# Patient Record
Sex: Male | Born: 1970 | Race: Black or African American | Hispanic: No | Marital: Single | Smoking: Current every day smoker
Health system: Southern US, Community
[De-identification: ages and names within clinical notes are randomized; demographics above are authoritative.]

## PROBLEM LIST (undated history)

## (undated) DIAGNOSIS — Z87442 Personal history of urinary calculi: Secondary | ICD-10-CM

## (undated) DIAGNOSIS — I10 Essential (primary) hypertension: Secondary | ICD-10-CM

## (undated) HISTORY — PX: BACK SURGERY: SHX140

---

## 2001-09-13 ENCOUNTER — Encounter: Payer: Self-pay | Admitting: Emergency Medicine

## 2001-09-13 ENCOUNTER — Emergency Department (HOSPITAL_COMMUNITY): Admission: EM | Admit: 2001-09-13 | Discharge: 2001-09-13 | Payer: Self-pay | Admitting: *Deleted

## 2003-10-16 ENCOUNTER — Emergency Department (HOSPITAL_COMMUNITY): Admission: EM | Admit: 2003-10-16 | Discharge: 2003-10-16 | Payer: Self-pay | Admitting: Emergency Medicine

## 2005-01-09 ENCOUNTER — Emergency Department (HOSPITAL_COMMUNITY): Admission: EM | Admit: 2005-01-09 | Discharge: 2005-01-09 | Payer: Self-pay | Admitting: Emergency Medicine

## 2005-12-01 ENCOUNTER — Emergency Department (HOSPITAL_COMMUNITY): Admission: EM | Admit: 2005-12-01 | Discharge: 2005-12-01 | Payer: Self-pay | Admitting: Emergency Medicine

## 2007-10-07 ENCOUNTER — Emergency Department (HOSPITAL_COMMUNITY): Admission: EM | Admit: 2007-10-07 | Discharge: 2007-10-07 | Payer: Self-pay | Admitting: Emergency Medicine

## 2009-03-16 ENCOUNTER — Emergency Department (HOSPITAL_COMMUNITY): Admission: EM | Admit: 2009-03-16 | Discharge: 2009-03-16 | Payer: Self-pay | Admitting: *Deleted

## 2010-12-01 ENCOUNTER — Encounter
Admission: RE | Admit: 2010-12-01 | Discharge: 2010-12-01 | Payer: Self-pay | Source: Home / Self Care | Admitting: Family Medicine

## 2011-03-27 ENCOUNTER — Emergency Department (HOSPITAL_BASED_OUTPATIENT_CLINIC_OR_DEPARTMENT_OTHER)
Admission: EM | Admit: 2011-03-27 | Discharge: 2011-03-27 | Disposition: A | Discharge status: Home / Self Care | Payer: BC Managed Care – PPO | Attending: Emergency Medicine | Admitting: Emergency Medicine

## 2011-03-27 DIAGNOSIS — M545 Low back pain, unspecified: Secondary | ICD-10-CM | POA: Insufficient documentation

## 2011-03-27 DIAGNOSIS — G8929 Other chronic pain: Secondary | ICD-10-CM | POA: Insufficient documentation

## 2011-04-06 LAB — COMPREHENSIVE METABOLIC PANEL
Albumin: 4.4 g/dL (ref 3.5–5.2)
BUN: 17 mg/dL (ref 6–23)
Chloride: 99 mEq/L (ref 96–112)
Creatinine, Ser: 1.28 mg/dL (ref 0.4–1.5)
Total Bilirubin: 1.9 mg/dL — ABNORMAL HIGH (ref 0.3–1.2)

## 2011-04-06 LAB — DIFFERENTIAL
Basophils Absolute: 0 10*3/uL (ref 0.0–0.1)
Lymphocytes Relative: 32 % (ref 12–46)
Monocytes Absolute: 0.5 10*3/uL (ref 0.1–1.0)
Neutro Abs: 4.8 10*3/uL (ref 1.7–7.7)
Neutrophils Relative %: 60 % (ref 43–77)

## 2011-04-06 LAB — CBC
HCT: 51.2 % (ref 39.0–52.0)
MCV: 89.7 fL (ref 78.0–100.0)
Platelets: 310 10*3/uL (ref 150–400)
RDW: 13 % (ref 11.5–15.5)
WBC: 7.9 10*3/uL (ref 4.0–10.5)

## 2011-04-06 LAB — URINALYSIS, ROUTINE W REFLEX MICROSCOPIC
Nitrite: NEGATIVE
Specific Gravity, Urine: 1.04 — ABNORMAL HIGH (ref 1.005–1.030)
Urobilinogen, UA: 1 mg/dL (ref 0.0–1.0)

## 2011-04-06 LAB — URINE MICROSCOPIC-ADD ON

## 2011-04-06 LAB — POTASSIUM: Potassium: 3.6 mEq/L (ref 3.5–5.1)

## 2011-04-06 LAB — LIPASE, BLOOD: Lipase: 30 U/L (ref 11–59)

## 2011-08-25 ENCOUNTER — Emergency Department (HOSPITAL_BASED_OUTPATIENT_CLINIC_OR_DEPARTMENT_OTHER)
Admission: EM | Admit: 2011-08-25 | Discharge: 2011-08-25 | Disposition: A | Discharge status: Other Acute Inpatient Hospital | Payer: Managed Care, Other (non HMO) | Source: Home / Self Care | Attending: Emergency Medicine | Admitting: Emergency Medicine

## 2011-08-25 ENCOUNTER — Emergency Department (HOSPITAL_COMMUNITY)
Admission: EM | Admit: 2011-08-25 | Discharge: 2011-08-25 | Disposition: A | Discharge status: Home / Self Care | Payer: Managed Care, Other (non HMO) | Attending: Emergency Medicine | Admitting: Emergency Medicine

## 2011-08-25 ENCOUNTER — Encounter: Payer: Self-pay | Admitting: *Deleted

## 2011-08-25 ENCOUNTER — Emergency Department (INDEPENDENT_AMBULATORY_CARE_PROVIDER_SITE_OTHER): Payer: Managed Care, Other (non HMO)

## 2011-08-25 DIAGNOSIS — R112 Nausea with vomiting, unspecified: Secondary | ICD-10-CM | POA: Insufficient documentation

## 2011-08-25 DIAGNOSIS — K4090 Unilateral inguinal hernia, without obstruction or gangrene, not specified as recurrent: Secondary | ICD-10-CM

## 2011-08-25 DIAGNOSIS — R197 Diarrhea, unspecified: Secondary | ICD-10-CM | POA: Insufficient documentation

## 2011-08-25 DIAGNOSIS — R1032 Left lower quadrant pain: Secondary | ICD-10-CM | POA: Insufficient documentation

## 2011-08-25 DIAGNOSIS — I10 Essential (primary) hypertension: Secondary | ICD-10-CM | POA: Insufficient documentation

## 2011-08-25 DIAGNOSIS — R109 Unspecified abdominal pain: Secondary | ICD-10-CM

## 2011-08-25 DIAGNOSIS — K469 Unspecified abdominal hernia without obstruction or gangrene: Secondary | ICD-10-CM | POA: Insufficient documentation

## 2011-08-25 DIAGNOSIS — K56609 Unspecified intestinal obstruction, unspecified as to partial versus complete obstruction: Secondary | ICD-10-CM

## 2011-08-25 DIAGNOSIS — F17200 Nicotine dependence, unspecified, uncomplicated: Secondary | ICD-10-CM | POA: Insufficient documentation

## 2011-08-25 HISTORY — DX: Essential (primary) hypertension: I10

## 2011-08-25 LAB — URINALYSIS, ROUTINE W REFLEX MICROSCOPIC
Glucose, UA: NEGATIVE mg/dL
Leukocytes, UA: NEGATIVE
Nitrite: NEGATIVE
Protein, ur: NEGATIVE mg/dL
Urobilinogen, UA: 0.2 mg/dL (ref 0.0–1.0)

## 2011-08-25 LAB — COMPREHENSIVE METABOLIC PANEL
ALT: 17 U/L (ref 0–53)
AST: 14 U/L (ref 0–37)
Albumin: 3.8 g/dL (ref 3.5–5.2)
CO2: 26 mEq/L (ref 19–32)
Calcium: 8.8 mg/dL (ref 8.4–10.5)
Chloride: 105 mEq/L (ref 96–112)
GFR calc non Af Amer: >60 mL/min (ref >60)
Sodium: 139 mEq/L (ref 135–145)
Total Bilirubin: 0.2 mg/dL — ABNORMAL LOW (ref 0.3–1.2)

## 2011-08-25 LAB — DIFFERENTIAL
Basophils Absolute: 0 10*3/uL (ref 0.0–0.1)
Basophils Relative: 0 % (ref 0–1)
Lymphocytes Relative: 45 % (ref 12–46)
Neutro Abs: 3.1 10*3/uL (ref 1.7–7.7)
Neutrophils Relative %: 44 % (ref 43–77)

## 2011-08-25 LAB — CBC
MCHC: 34.4 g/dL (ref 30.0–36.0)
Platelets: 372 10*3/uL (ref 150–400)
RDW: 12.8 % (ref 11.5–15.5)
WBC: 7 10*3/uL (ref 4.0–10.5)

## 2011-08-25 MED ORDER — HYDROMORPHONE HCL 1 MG/ML IJ SOLN
1.0000 mg | Freq: Once | INTRAMUSCULAR | Status: AC
  Administered 2011-08-25: 1 mg via INTRAVENOUS
  Filled 2011-08-25: qty 1

## 2011-08-25 MED ORDER — ONDANSETRON HCL 4 MG/2ML IJ SOLN
4.0000 mg | Freq: Once | INTRAMUSCULAR | Status: AC
  Administered 2011-08-25: 4 mg via INTRAVENOUS
  Filled 2011-08-25: qty 2

## 2011-08-25 MED ORDER — ONDANSETRON HCL 4 MG/2ML IJ SOLN
4.0000 mg | Freq: Once | INTRAMUSCULAR | Status: AC
Start: 2011-08-25 — End: 2011-08-25
  Administered 2011-08-25: 4 mg via INTRAVENOUS
  Filled 2011-08-25: qty 2

## 2011-08-25 MED ORDER — IOHEXOL 300 MG/ML  SOLN: 100.0000 mL | Freq: Once | INTRAMUSCULAR | Status: DC | PRN

## 2011-08-25 NOTE — ED Notes (Signed)
Spoke with Electa Sniff, RN charge nurse at [facility] ED and discussed pt  transfer via POV with saline lock in place.  Request per Langston Masker, PAC.

## 2011-08-25 NOTE — ED Notes (Signed)
Awaiting admission.

## 2011-08-25 NOTE — ED Notes (Signed)
Pt sts pain "is coming back".

## 2011-08-25 NOTE — ED Notes (Signed)
Pt is asking to transfer to facility via POV.  MD informed of pt request.

## 2011-08-25 NOTE — ED Notes (Signed)
Pt d/c'd from ED and sent to [facility] ED.  Pt transported via POV and stable upon transport will all documents.

## 2011-08-25 NOTE — ED Notes (Addendum)
Pt c/o N/V/D x2 days with "slight" abd pain that he rates 10/10.

## 2011-08-25 NOTE — ED Provider Notes (Signed)
History     CSN: [id number] Arrival date & time: 08/25/2011 11:48 AM  Chief Complaint  Patient presents with  . Abdominal Pain   Patient is a 40 y.o. male presenting with abdominal pain.  Abdominal Pain The primary symptoms of the illness include abdominal pain, nausea and vomiting. The current episode started 2 days ago. The onset of the illness was gradual. The problem has not changed since onset. The abdominal pain radiates to the LLQ. The severity of the abdominal pain is 9/10. The abdominal pain is relieved by nothing. The abdominal pain is exacerbated by vomiting.  The patient has not had a change in bowel habit. Additional symptoms associated with the illness include back pain. Symptoms associated with the illness do not include chills, anorexia, diaphoresis, heartburn or frequency. Significant associated medical issues do not include PUD, GERD, inflammatory bowel disease, diabetes, gallstones, liver disease or diverticulitis.    Past Medical History  Diagnosis Date  . Hypertension     Past Surgical History  Procedure Date  . Back surgery     No family history on file.  History  Substance Use Topics  . Smoking status: Current Everyday Smoker  . Smokeless tobacco: Not on file  . Alcohol Use: Yes      Review of Systems  Constitutional: Negative for chills and diaphoresis.  Gastrointestinal: Positive for nausea, vomiting and abdominal pain. Negative for heartburn and anorexia.  Genitourinary: Negative for frequency.  Musculoskeletal: Positive for back pain.  All other systems reviewed and are negative.    Physical Exam  BP 145/82  Pulse 80  Temp(Src) 98.4 F (36.9 C) (Oral)  Resp 18  SpO2 100%  Physical Exam  Nursing note and vitals reviewed. Constitutional: He is oriented to person, place, and time. He appears well-developed and well-nourished.  HENT:  Head: Normocephalic and atraumatic.  Eyes: Conjunctivae and EOM are normal. Pupils are equal, round,  and reactive to light.  Neck: Normal range of motion. Neck supple.  Cardiovascular: Normal rate.   Pulmonary/Chest: Effort normal.  Abdominal: Soft. There is tenderness. There is guarding.  Musculoskeletal: Normal range of motion.  Neurological: He is alert and oriented to person, place, and time. He has normal reflexes.  Skin: Skin is warm and dry.  Psychiatric: He has a normal mood and affect.    ED Course  Procedures  MDM Pt given Iv fluids and pain medications.  Pt continues to have abdominal pain in left lower abdomen,  Ct scan shows a left sided direct inguinal hernia containing a portion of small bowel with partial small bowel obstruction above. I spoke to the General Surgeon who advised to send pt to [facility] to be seen by Dr. Johna Sheriff.     Langston Masker, [place] 08/25/11 2048  Langston Masker, [place] 08/25/11 2050

## 2011-08-25 NOTE — ED Notes (Signed)
Pt ambulatory to lobby, states he will return.  Pt appears in NAD.

## 2011-08-29 ENCOUNTER — Encounter (HOSPITAL_COMMUNITY): Payer: Self-pay | Admitting: *Deleted

## 2011-08-29 NOTE — ED Notes (Signed)
Pt returned to [facility] registration desk asking for note to return to work opened chart to find  Out how long pt was to be out of work. Pt states he called [facility] this afternoon but it was too late to be seen.Explained to pt that he needs to call the [facility] [place] [facility] office tomorrow and schedule a consult visit as recommended. Pt verbalizes understanding

## 2011-08-30 ENCOUNTER — Encounter (INDEPENDENT_AMBULATORY_CARE_PROVIDER_SITE_OTHER): Payer: Managed Care, Other (non HMO) | Admitting: General Surgery

## 2011-09-01 NOTE — Consult Note (Signed)
Chad Saunders, Chad Saunders NO.:  [id number]  MEDICAL RECORD NO.:  [id number]  LOCATION:  [facility]                         FACILITY:  [facility]  PHYSICIAN:  Sharlet Salina T. Cyruss Arata, M.D.DATE OF BIRTH:  1971/01/02  DATE OF CONSULTATION: DATE OF DISCHARGE:  08/25/2011                                CONSULTATION   DATE OF CONSULTATION:  August 25, 2011.  CHIEF COMPLAINT:  Nausea, vomiting, diarrhea, left lower quadrant abdominal pain.  HISTORY OF PRESENT ILLNESS:  The patient is a 40 year old male who presented to [facility] Emergency Room today with a 24-hour illness.  He complains of the onset of nausea, vomiting, frequent diarrhea, and crampy left lower quadrant abdominal pain, which began yesterday.  He has no history of any similar or chronic symptoms.  On questioning, he denies any left groin pain or bulge and was not aware he had any hernia. The patient was evaluated in the emergency room there and CT scan of the abdomen and pelvis was obtained, which I reviewed.  This does show a left inguinal hernia containing a loop of small bowel and there may be some minimally dilated proximal bowel with question of partial obstruction at this site.  The patient after evaluation there was felt to have an incarcerated inguinal hernia and was transferred to [facility] Emergency Room for possible surgical treatment.  PAST MEDICAL HISTORY: 1. History of hypertension currently on no medications. 2. He has chronic back pain.  PAST SURGICAL HISTORY:  Significant only for back surgery.  MEDICATIONS:  None currently.  ALLERGIES:  None.  SOCIAL HISTORY:  He is married.  He does not drink alcohol regularly and smokes cigarettes daily.  FAMILY HISTORY:  Noncontributory.  REVIEW OF SYSTEMS:  GENERAL:  No fever or chills.  RESPIRATORY:  No shortness breath, cough, wheezing.  CARDIAC:  No chest pain or history of heart disease.  GU:  No urinary burning or frequency.  PHYSICAL  EXAMINATION:  VITAL SIGNS:  He is afebrile, heart rate 69, blood pressure 131/84, respirations 18. GENERAL:  He is a well-developed, alert African-American male in no distress. SKIN:  Warm and dry.  No rash or infection. LUNGS:  Clear without wheezing or increased work of breathing. CARDIAC:  Regular rate and rhythm.  No murmurs.  No edema. ABDOMEN:  Nondistended.  Generally soft with very minimal left lower quadrant tenderness and no guarding.  With the patient lying flat, I can feel no evidence of hernia in either groin.  With the patient standing and doing Valsalva maneuver, there is a moderate-sized left inguinal hernia, but this is soft and nontender and easily reducible.  LABORATORY STUDIES:  Laboratory CBC from earlier today shows a white count of 7000, hemoglobin 14.8, electrolytes all within normal limits. Urinalysis unremarkable.  ASSESSMENT/PLAN:  Acute illness of nausea, vomiting, diarrhea and some crampy lower abdominal pain.  He has a left inguinal hernia confirmed on exam, but it is clearly not incarcerated.  I think the most likely scenario is that he has a viral gastroenteritis and the hernia was noted incidentally on CT and is not responsible for his symptoms. Alternatively, he may have had a briefly incarcerated hernia, that is now  spontaneously reduced.  This however, will really not would not explain his diarrhea and he had no painful lump in the groin as you would expect with incarcerated hernia.  The patient is to be discharged home with symptomatic management.  He will call us if he is not feeling better in 24-48 hours.  He will be followed up electively in our office to consider inguinal hernia repair.     Lorne Skeens. Clarkson Rosselli, M.D.     Tory Emerald  D:  08/25/2011  T:  08/26/2011  Job:  [id number]  Electronically Signed by Glenna Fellows M.D. on 09/01/2011 05:53:18 PM

## 2011-09-03 ENCOUNTER — Emergency Department (HOSPITAL_COMMUNITY)
Admission: EM | Admit: 2011-09-03 | Discharge: 2011-09-03 | Disposition: A | Discharge status: Home / Self Care | Payer: Managed Care, Other (non HMO) | Attending: Emergency Medicine | Admitting: Emergency Medicine

## 2011-09-11 NOTE — ED Provider Notes (Signed)
Medical screening examination/treatment/procedure(s) were performed by non-physician practitioner and as supervising physician I was immediately available for consultation/collaboration.   Rolan Bucco, MD 09/11/11 [phone]

## 2011-11-01 ENCOUNTER — Encounter (HOSPITAL_COMMUNITY): Payer: Self-pay

## 2011-11-01 ENCOUNTER — Emergency Department (HOSPITAL_COMMUNITY)
Admission: EM | Admit: 2011-11-01 | Discharge: 2011-11-01 | Disposition: A | Discharge status: Home / Self Care | Payer: Managed Care, Other (non HMO) | Attending: Emergency Medicine | Admitting: Emergency Medicine

## 2011-11-01 DIAGNOSIS — L0231 Cutaneous abscess of buttock: Secondary | ICD-10-CM | POA: Insufficient documentation

## 2011-11-01 MED ORDER — DOXYCYCLINE HYCLATE 100 MG PO CAPS: 100.0000 mg | ORAL_CAPSULE | Freq: Two times a day (BID) | ORAL | Status: AC

## 2011-11-01 MED ORDER — LIDOCAINE-EPINEPHRINE 2 %-1:100000 IJ SOLN
20.0000 mL | Freq: Once | INTRAMUSCULAR | Status: AC
  Administered 2011-11-01: 1 mL via INTRADERMAL
  Filled 2011-11-01: qty 1

## 2011-11-01 MED ORDER — HYDROCODONE-ACETAMINOPHEN 5-500 MG PO TABS: 1.0000 | ORAL_TABLET | Freq: Four times a day (QID) | ORAL | Status: AC | PRN

## 2011-11-01 NOTE — ED Notes (Signed)
Pt. Wound cleaned up and bandage and gauze applied.

## 2011-11-01 NOTE — ED Provider Notes (Signed)
History     CSN: [id number] Arrival date & time: 11/01/2011  4:19 AM   First MD Initiated Contact with Patient 11/01/11 [phone]      Chief Complaint  Patient presents with  . Recurrent Skin Infections    (Consider location/radiation/quality/duration/timing/severity/associated sxs/prior treatment) The history is provided by the patient.  pt c/o left buttock abscess for past 5 days. No hx same. Denies injury, bite or sting to area . No fevers or chills. Constant, dull, nonradiating pain, worse w palpation. No hx mrsa/boils. No abd pain.   Past Medical History  Diagnosis Date  . Hypertension     Past Surgical History  Procedure Date  . Back surgery     History reviewed. No pertinent family history.  History  Substance Use Topics  . Smoking status: Current Everyday Smoker  . Smokeless tobacco: Not on file  . Alcohol Use: Yes      Review of Systems  Constitutional: Negative for fever and chills.  HENT: Negative for neck pain.   Eyes: Negative for pain.  Respiratory: Negative for shortness of breath.   Cardiovascular: Negative for chest pain.  Gastrointestinal: Negative for abdominal pain.  Musculoskeletal: Negative for back pain.  Skin: Negative for rash.  Neurological: Negative for headaches.  Hematological: Does not bruise/bleed easily.    Allergies  Review of patient's allergies indicates no known allergies.  Home Medications   Current Outpatient Rx  Name Route Sig Dispense Refill  . OXYCODONE-ACETAMINOPHEN 10-325 MG PO TABS Oral Take 2 tablets by mouth every 4 (four) hours as needed. pain       BP 156/85  Pulse 94  Temp(Src) 99.5 F (37.5 C) (Oral)  Resp 18  SpO2 99%  Physical Exam  Nursing note and vitals reviewed. Constitutional: He is oriented to person, place, and time. He appears well-developed and well-nourished. No distress.  HENT:  Head: Atraumatic.  Eyes: Pupils are equal, round, and reactive to light.  Neck: Neck supple. No tracheal  deviation present.  Cardiovascular: Normal rate.   Pulmonary/Chest: Effort normal. No accessory muscle usage. No respiratory distress.  Abdominal: Soft. There is no tenderness.  Musculoskeletal: Normal range of motion.  Neurological: He is alert and oriented to person, place, and time.  Skin: Skin is warm and dry.       8 cm diameter abscess to left buttock, draining small amount purulent material in center of area of fluctuance.   Psychiatric: He has a normal mood and affect.    ED Course  Procedures (including critical care time)  Labs Reviewed - No data to display No results found.   No diagnosis found.    MDM  Pt drove self. I and D. Packing.    INCISION AND DRAINAGE Performed by: Suzi Roots Consent: Verbal consent obtained. Risks and benefits: risks, benefits and alternatives were discussed Type: abscess  Body area: left buttock  Anesthesia: local infiltration  Local anesthetic: lidocaine w epinephrine  Anesthetic total: 6 ml  Complexity: complex Blunt dissection to break up loculations  Drainage: purulent  Drainage amount: large  Packing material: 1 in iodoform gauze  Patient tolerance: Patient tolerated the procedure well with no immediate complications.  Sterile dressing.       Suzi Roots, MD 11/01/11 [phone]

## 2011-11-01 NOTE — ED Notes (Signed)
Pt complains of an abcess on his buttocks for 5 days

## 2011-12-26 ENCOUNTER — Emergency Department (HOSPITAL_BASED_OUTPATIENT_CLINIC_OR_DEPARTMENT_OTHER)
Admission: EM | Admit: 2011-12-26 | Discharge: 2011-12-26 | Disposition: A | Discharge status: Home / Self Care | Payer: Managed Care, Other (non HMO) | Attending: Emergency Medicine | Admitting: Emergency Medicine

## 2011-12-26 ENCOUNTER — Encounter (HOSPITAL_BASED_OUTPATIENT_CLINIC_OR_DEPARTMENT_OTHER): Payer: Self-pay | Admitting: *Deleted

## 2011-12-26 DIAGNOSIS — F17200 Nicotine dependence, unspecified, uncomplicated: Secondary | ICD-10-CM | POA: Insufficient documentation

## 2011-12-26 DIAGNOSIS — H109 Unspecified conjunctivitis: Secondary | ICD-10-CM | POA: Insufficient documentation

## 2011-12-26 DIAGNOSIS — I10 Essential (primary) hypertension: Secondary | ICD-10-CM | POA: Insufficient documentation

## 2011-12-26 MED ORDER — BACITRACIN-POLYMYXIN B 500-10000 UNIT/GM OP OINT: TOPICAL_OINTMENT | Freq: Two times a day (BID) | OPHTHALMIC | Status: AC

## 2011-12-26 NOTE — ED Notes (Signed)
Per patient, he stated he was unsure of what was being asked about vision. Patient stated he "did not have any urinary problems or anything down there". I notified nurse Melissa.

## 2011-12-26 NOTE — ED Notes (Signed)
One week history of reddened irritated eyes with constant running some days thicker discharge that is matted upon waking. Pt reports has  Been using tobramyacin drops for one week given to him by his doctor and states his eyes are getting worse

## 2011-12-26 NOTE — ED Provider Notes (Signed)
History     CSN: [id number]  Arrival date & time 12/26/11  1133   First MD Initiated Contact with Patient 12/26/11 1232      Chief Complaint  Patient presents with  . Conjunctivitis    (Consider location/radiation/quality/duration/timing/severity/associated sxs/prior treatment) HPI  40yoM is a healthy presents with eye redness. Patient states that he has to deal with this problem approximately 6 days. He states that he had left eye redness and discharge. He was seen by his physician and prescribed tobramycin eye drops he states that his left eye is improving but now his right eye is worse and he's run out of his tobramycin eyedrops. He states that he has had constant watering of his eyes and that his eyes are followed at discharge in the morning. He states that he has blurred vision when his eyes are watering. There is some photophobia. He denies foreign body sensation he states that his eyes are "scratchy". He denies fevers, chills. When originally I see did state that he had some dysuria and he states it nursing staff that he didn't understand the question and he denied dysuria. He denies arthritic symptoms. No recent known exposure to FB.   ED Notes, ED Provider Notes from 12/26/11 0000 to 12/26/11 12:51:34       Melissa Ramer Brantley, RN 12/26/2011 12:50      One week history of reddened irritated eyes with constant running some days thicker discharge that is matted upon waking. Pt reports has Been using tobramyacin drops for one week given to him by his doctor and states his eyes are getting worse    Past Medical History  Diagnosis Date  . Hypertension     Past Surgical History  Procedure Date  . Back surgery     History reviewed. No pertinent family history.  History  Substance Use Topics  . Smoking status: Current Everyday Smoker  . Smokeless tobacco: Not on file  . Alcohol Use: Yes    Review of Systems  All other systems reviewed and are negative.   except as noted  HPI   Allergies  Review of patient's allergies indicates no known allergies.  Home Medications   Current Outpatient Rx  Name Route Sig Dispense Refill  . BACITRACIN-POLYMYXIN B 500-10000 UNIT/GM OP OINT Both Eyes Place into both eyes every 12 (twelve) hours. apply to eye every 12 hours while awake 3.5 g 0  . OXYCODONE-ACETAMINOPHEN 10-325 MG PO TABS Oral Take 2 tablets by mouth every 4 (four) hours as needed. pain       BP 132/90  Pulse 80  Temp(Src) 98.3 F (36.8 C) (Oral)  Resp 16  Ht 6' (1.829 m)  Wt 200 lb (90.719 kg)  BMI 27.12 kg/m2  SpO2 98%  Physical Exam  Nursing note and vitals reviewed. Constitutional: He is oriented to person, place, and time. He appears well-developed and well-nourished. No distress.  HENT:  Head: Atraumatic.  Mouth/Throat: Oropharynx is clear and moist.  Eyes: EOM are normal. Pupils are equal, round, and reactive to light. Right eye exhibits discharge. Left eye exhibits discharge.       B/L conj injected perrl EOM intact without pain No periorbital swelling No photophobia Eyes actively watering No FB under eyelids  Neck: Neck supple.  Cardiovascular: Normal rate, regular rhythm, normal heart sounds and intact distal pulses.  Exam reveals no gallop and no friction rub.   No murmur heard. Pulmonary/Chest: Effort normal. No respiratory distress. He has no wheezes. He has no  rales.  Abdominal: Soft. Bowel sounds are normal. There is no tenderness. There is no rebound and no guarding.  Musculoskeletal: Normal range of motion. He exhibits no edema and no tenderness.  Neurological: He is alert and oriented to person, place, and time.  Skin: Skin is warm and dry.  Psychiatric: He has a normal mood and affect.   ED Course  Procedures (including critical care time)  Labs Reviewed - No data to display No results found.   1. Conjunctivitis    MDM  Conjunctivitis.No contact use. +blurry vision but eyes constantly watering. Low susp reiter's  , gc/chl. No susp FB. Will send home with polymyxinB/trimethoprim eye gtt. Precautions for return.         Forbes Cellar, MD 12/26/11 [phone]

## 2012-10-04 ENCOUNTER — Encounter (HOSPITAL_COMMUNITY): Payer: Self-pay | Admitting: Emergency Medicine

## 2012-10-04 ENCOUNTER — Emergency Department (HOSPITAL_COMMUNITY)
Admission: EM | Admit: 2012-10-04 | Discharge: 2012-10-04 | Disposition: A | Discharge status: Home / Self Care | Payer: Managed Care, Other (non HMO) | Attending: Emergency Medicine | Admitting: Emergency Medicine

## 2012-10-04 ENCOUNTER — Emergency Department (HOSPITAL_COMMUNITY): Payer: Managed Care, Other (non HMO)

## 2012-10-04 DIAGNOSIS — I10 Essential (primary) hypertension: Secondary | ICD-10-CM | POA: Insufficient documentation

## 2012-10-04 DIAGNOSIS — IMO0002 Reserved for concepts with insufficient information to code with codable children: Secondary | ICD-10-CM | POA: Insufficient documentation

## 2012-10-04 DIAGNOSIS — M7989 Other specified soft tissue disorders: Secondary | ICD-10-CM | POA: Insufficient documentation

## 2012-10-04 DIAGNOSIS — S81019A Laceration without foreign body, unspecified knee, initial encounter: Secondary | ICD-10-CM

## 2012-10-04 DIAGNOSIS — M79609 Pain in unspecified limb: Secondary | ICD-10-CM | POA: Insufficient documentation

## 2012-10-04 DIAGNOSIS — S81009A Unspecified open wound, unspecified knee, initial encounter: Secondary | ICD-10-CM | POA: Insufficient documentation

## 2012-10-04 DIAGNOSIS — M25569 Pain in unspecified knee: Secondary | ICD-10-CM | POA: Insufficient documentation

## 2012-10-04 DIAGNOSIS — T07XXXA Unspecified multiple injuries, initial encounter: Secondary | ICD-10-CM

## 2012-10-04 DIAGNOSIS — S91009A Unspecified open wound, unspecified ankle, initial encounter: Secondary | ICD-10-CM | POA: Insufficient documentation

## 2012-10-04 MED ORDER — CYCLOBENZAPRINE HCL 10 MG PO TABS: 10.0000 mg | ORAL_TABLET | Freq: Two times a day (BID) | ORAL | Status: DC | PRN

## 2012-10-04 MED ORDER — OXYCODONE-ACETAMINOPHEN 5-325 MG PO TABS
1.0000 | ORAL_TABLET | Freq: Once | ORAL | Status: AC
  Administered 2012-10-04: 1 via ORAL
  Filled 2012-10-04: qty 1

## 2012-10-04 MED ORDER — TETANUS-DIPHTHERIA TOXOIDS TD 5-2 LFU IM INJ
0.5000 mL | INJECTION | Freq: Once | INTRAMUSCULAR | Status: AC
  Administered 2012-10-04: 0.5 mL via INTRAMUSCULAR
  Filled 2012-10-04: qty 0.5

## 2012-10-04 MED ORDER — CEPHALEXIN 250 MG PO CAPS: 250.0000 mg | ORAL_CAPSULE | Freq: Four times a day (QID) | ORAL | Status: DC

## 2012-10-04 MED ORDER — IBUPROFEN 600 MG PO TABS: 600.0000 mg | ORAL_TABLET | Freq: Four times a day (QID) | ORAL | Status: DC | PRN

## 2012-10-04 MED ORDER — LIDOCAINE-EPINEPHRINE 2 %-1:100000 IJ SOLN
20.0000 mL | Freq: Once | INTRAMUSCULAR | Status: DC
  Filled 2012-10-04: qty 20

## 2012-10-04 MED ORDER — OXYCODONE-ACETAMINOPHEN 5-325 MG PO TABS: 1.0000 | ORAL_TABLET | Freq: Four times a day (QID) | ORAL | Status: DC | PRN
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
## 2012-10-04 NOTE — ED Notes (Signed)
Patient ambulated down hallway.  Patient instructed to return to room and not to get up until he is evaluated.  Patient given a warm blanket and girlfriend called back to the room per request of patient.
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
## 2014-08-26 ENCOUNTER — Ambulatory Visit
Admission: RE | Admit: 2014-08-26 | Discharge: 2014-08-26 | Disposition: A | Discharge status: Home / Self Care | Payer: PRIVATE HEALTH INSURANCE | Source: Ambulatory Visit | Attending: Family Medicine | Admitting: Family Medicine

## 2014-08-26 ENCOUNTER — Other Ambulatory Visit: Payer: Self-pay | Admitting: Family Medicine

## 2014-08-26 DIAGNOSIS — R52 Pain, unspecified: Secondary | ICD-10-CM

## 2016-03-01 ENCOUNTER — Ambulatory Visit: Payer: Self-pay | Admitting: General Surgery

## 2016-08-12 ENCOUNTER — Emergency Department (HOSPITAL_BASED_OUTPATIENT_CLINIC_OR_DEPARTMENT_OTHER): Payer: Self-pay

## 2016-08-12 ENCOUNTER — Encounter (HOSPITAL_BASED_OUTPATIENT_CLINIC_OR_DEPARTMENT_OTHER): Payer: Self-pay | Admitting: Emergency Medicine

## 2016-08-12 ENCOUNTER — Emergency Department (HOSPITAL_BASED_OUTPATIENT_CLINIC_OR_DEPARTMENT_OTHER)
Admission: EM | Admit: 2016-08-12 | Discharge: 2016-08-12 | Disposition: A | Discharge status: Home / Self Care | Payer: Self-pay | Attending: Emergency Medicine | Admitting: Emergency Medicine

## 2016-08-12 DIAGNOSIS — I10 Essential (primary) hypertension: Secondary | ICD-10-CM | POA: Insufficient documentation

## 2016-08-12 DIAGNOSIS — M79644 Pain in right finger(s): Secondary | ICD-10-CM | POA: Insufficient documentation

## 2016-08-12 DIAGNOSIS — F17200 Nicotine dependence, unspecified, uncomplicated: Secondary | ICD-10-CM | POA: Insufficient documentation

## 2016-08-12 MED ORDER — HYDROCODONE-ACETAMINOPHEN 5-325 MG PO TABS: 1.0000 | ORAL_TABLET | ORAL | 0 refills | Status: DC | PRN

## 2016-08-12 MED ORDER — CEPHALEXIN 500 MG PO CAPS: 500.0000 mg | ORAL_CAPSULE | Freq: Three times a day (TID) | ORAL | 0 refills | Status: AC

## 2016-08-12 NOTE — ED Triage Notes (Signed)
Patient with pain to his right index finger

## 2016-08-12 NOTE — ED Provider Notes (Signed)
[facility]   CSN: [id number] Arrival date & time: 08/12/16  1502     History   Chief Complaint Chief Complaint  Patient presents with  . Finger Injury    HPI Chad Ramusaron Minassian is a 45 y.o. male.  Patient is a 45 year old male with no pertinent past medical history who presents the ED with complaint of right index finger pain that started 3 days ago. Patient reports while he was laying in bed he began to have pain and discomfort to his right index finger. Endorses mild associated swelling. Patient reports pain is worse with movement. Denies any known trauma or injury. He reports taking Advil at home with mild intermittent relief. Endorses associated intermittent tingling to the finger. Denies fever, chills, redness, warmth, drainage. Tetanus up-to-date.      Past Medical History:  Diagnosis Date  . Hypertension     There are no active problems to display for this patient.   Past Surgical History:  Procedure Laterality Date  . BACK SURGERY         Home Medications    Prior to Admission medications   Medication Sig Start Date End Date Taking? Authorizing Provider  cephALEXin (KEFLEX) 500 MG capsule Take 1 capsule (500 mg total) by mouth 3 (three) times daily. 08/12/16 08/19/16  Barrett HenleNicole Elizabeth Bladyn Tipps, PA-C  cyclobenzaprine (FLEXERIL) 10 MG tablet Take 1 tablet (10 mg total) by mouth 2 (two) times daily as needed for muscle spasms. 10/04/12   Tobin ChadMarwan T Powers, MD  HYDROcodone-acetaminophen (NORCO/VICODIN) 5-325 MG tablet Take 1 tablet by mouth every 4 (four) hours as needed. 08/12/16   Barrett HenleNicole Elizabeth Debby Clyne, PA-C  ibuprofen (ADVIL,MOTRIN) 600 MG tablet Take 1 tablet (600 mg total) by mouth every 6 (six) hours as needed for pain. 10/04/12   Tobin ChadMarwan T Powers, MD  oxyCODONE-acetaminophen (PERCOCET) 5-325 MG per tablet Take 1 tablet by mouth every 6 (six) hours as needed for pain. 10/04/12   Tobin ChadMarwan T Powers, MD    Family History History reviewed. No  pertinent family history.  Social History Social History  Substance Use Topics  . Smoking status: Current Every Day Smoker  . Smokeless tobacco: Never Used  . Alcohol use Yes     Allergies   Review of patient's allergies indicates no known allergies.   Review of Systems Review of Systems  Constitutional: Negative for fever.  Musculoskeletal: Positive for arthralgias (right index finger) and joint swelling.  Skin: Negative for wound.  Neurological: Negative for weakness.       Tingling     Physical Exam Updated Vital Signs BP 147/99 (BP Location: Left Arm)   Pulse 73   Temp 98.2 F (36.8 C) (Oral)   Resp 18   Ht 6' (1.829 m)   Wt 102.1 kg   SpO2 100%   BMI 30.52 kg/m   Physical Exam  Constitutional: He is oriented to person, place, and time. He appears well-developed and well-nourished.  HENT:  Head: Normocephalic and atraumatic.  Eyes: Conjunctivae and EOM are normal. Right eye exhibits no discharge. Left eye exhibits no discharge. No scleral icterus.  Neck: Normal range of motion. Neck supple.  Cardiovascular: Normal rate and intact distal pulses.   Pulmonary/Chest: Effort normal.  Musculoskeletal: Normal range of motion. He exhibits tenderness. He exhibits no edema.       Right hand: He exhibits tenderness and swelling. He exhibits normal range of motion, normal two-point discrimination, normal capillary refill, no deformity and no laceration. Normal sensation noted.  Normal strength noted.       Hands: Mild swelling noted to right index finger. Right distal phalanx TTP. No erythema, warmth or drainage noted to finger or nail. FROM of right 2nd DIP, PIP and MCP with 5/5 strength. Sensation grossly intact. Cap refill <2. 2+ Radial pulse.   Neurological: He is alert and oriented to person, place, and time.  Skin: Skin is warm and dry.  Nursing note and vitals reviewed.    ED Treatments / Results  Labs (all labs ordered are listed, but only abnormal results are  displayed) Labs Reviewed - No data to display  EKG  EKG Interpretation None       Radiology Dg Finger Index Right  Result Date: 08/12/2016 CLINICAL DATA:  Right index finger pain, swelling for 3 days, no known injury EXAM: RIGHT INDEX FINGER 2+V COMPARISON:  None. FINDINGS: There is no evidence of fracture or dislocation. There is no evidence of arthropathy or other focal bone abnormality. Soft tissues are unremarkable. IMPRESSION: Negative. Electronically Signed   By: Natasha MeadLiviu  Pop M.D.   On: 08/12/2016 16:35    Procedures Procedures (including critical care time)  Medications Ordered in ED Medications - No data to display   Initial Impression / Assessment and Plan / ED Course  I have reviewed the triage vital signs and the nursing notes.  Pertinent labs & imaging results that were available during my care of the patient were reviewed by me and considered in my medical decision making (see chart for details).  Clinical Course   Patient presents with right index finger pain and swelling with mild intermittent tingling, denies any known injury or trauma. Tetanus up-to-date. VSS. Exam revealed mild swelling and tenderness to right index finger, pain worse with palpation of distal phalanx. Full range of motion of right second DIP, PIP and MCP with 5/5 strength against resistance. Right hand neurovascularly intact. Right index finger xray unremarkable. No evidence of obvious felon or paronychia on exam, however due to swelling and worsening TTP of distal phalanax, will d/c pt home on keflex due to concern for early onset paronychia/felon without evidence of abscess. Discussed results and plan for d/c with pt. Discussed return precautions with pt.    Final Clinical Impressions(s) / ED Diagnoses   Final diagnoses:  Finger pain, right    New Prescriptions New Prescriptions   CEPHALEXIN (KEFLEX) 500 MG CAPSULE    Take 1 capsule (500 mg total) by mouth 3 (three) times daily.    HYDROCODONE-ACETAMINOPHEN (NORCO/VICODIN) 5-325 MG TABLET    Take 1 tablet by mouth every 4 (four) hours as needed.     Satira Sarkicole Elizabeth K[place] ParkNadeau, [place] JerseyPA-C 08/12/16 1644    Nelva Nayobert Beaton, MD 08/13/16 [phone]

## 2016-08-12 NOTE — Discharge Instructions (Signed)
Take your antibiotic as prescribed until you've completed the prescription. Take your pain medications as prescribed to stand for pain relief. I also recommend resting, elevating and applying ice your finger for 15 minutes 3-4 times daily to help with pain and swelling. I recommend following up with your primary care provider in the next 3-4 days for recheck. Please return to the Emergency Department if symptoms worsen or new onset of fever, redness, swelling, warmth, drainage, decreased ROM.

## 2019-07-18 ENCOUNTER — Emergency Department (HOSPITAL_COMMUNITY): Payer: BC Managed Care – PPO

## 2019-07-18 ENCOUNTER — Emergency Department (HOSPITAL_COMMUNITY)
Admission: EM | Admit: 2019-07-18 | Discharge: 2019-07-18 | Disposition: A | Discharge status: Home / Self Care | Payer: BC Managed Care – PPO | Attending: Emergency Medicine | Admitting: Emergency Medicine

## 2019-07-18 ENCOUNTER — Encounter (HOSPITAL_COMMUNITY): Payer: Self-pay | Admitting: *Deleted

## 2019-07-18 DIAGNOSIS — R0602 Shortness of breath: Secondary | ICD-10-CM | POA: Insufficient documentation

## 2019-07-18 DIAGNOSIS — J069 Acute upper respiratory infection, unspecified: Secondary | ICD-10-CM | POA: Insufficient documentation

## 2019-07-18 DIAGNOSIS — S199XXA Unspecified injury of neck, initial encounter: Secondary | ICD-10-CM | POA: Diagnosis present

## 2019-07-18 DIAGNOSIS — Y999 Unspecified external cause status: Secondary | ICD-10-CM | POA: Insufficient documentation

## 2019-07-18 DIAGNOSIS — Y929 Unspecified place or not applicable: Secondary | ICD-10-CM | POA: Insufficient documentation

## 2019-07-18 DIAGNOSIS — X58XXXA Exposure to other specified factors, initial encounter: Secondary | ICD-10-CM | POA: Insufficient documentation

## 2019-07-18 DIAGNOSIS — Z20828 Contact with and (suspected) exposure to other viral communicable diseases: Secondary | ICD-10-CM | POA: Diagnosis not present

## 2019-07-18 DIAGNOSIS — S161XXA Strain of muscle, fascia and tendon at neck level, initial encounter: Secondary | ICD-10-CM | POA: Diagnosis not present

## 2019-07-18 DIAGNOSIS — Y939 Activity, unspecified: Secondary | ICD-10-CM | POA: Insufficient documentation

## 2019-07-18 DIAGNOSIS — I10 Essential (primary) hypertension: Secondary | ICD-10-CM | POA: Diagnosis not present

## 2019-07-18 DIAGNOSIS — F17200 Nicotine dependence, unspecified, uncomplicated: Secondary | ICD-10-CM | POA: Insufficient documentation

## 2019-07-18 LAB — COMPREHENSIVE METABOLIC PANEL
ALT: 32 U/L (ref 0–44)
AST: 25 U/L (ref 15–41)
Albumin: 3.9 g/dL (ref 3.5–5.0)
Alkaline Phosphatase: 54 U/L (ref 38–126)
Anion gap: 7 (ref 5–15)
BUN: 15 mg/dL (ref 6–20)
CO2: 23 mmol/L (ref 22–32)
Calcium: 8.7 mg/dL — ABNORMAL LOW (ref 8.9–10.3)
Chloride: 107 mmol/L (ref 98–111)
Creatinine, Ser: 1.09 mg/dL (ref 0.61–1.24)
GFR calc Af Amer: >60 mL/min (ref >60)
GFR calc non Af Amer: >60 mL/min (ref >60)
Glucose, Bld: 90 mg/dL (ref 70–99)
Potassium: 3.6 mmol/L (ref 3.5–5.1)
Sodium: 137 mmol/L (ref 135–145)
Total Bilirubin: 0.6 mg/dL (ref 0.3–1.2)
Total Protein: 7.2 g/dL (ref 6.5–8.1)

## 2019-07-18 LAB — CBC WITH DIFFERENTIAL/PLATELET
Abs Immature Granulocytes: 0.02 10*3/uL (ref 0.00–0.07)
Basophils Absolute: 0 10*3/uL (ref 0.0–0.1)
Basophils Relative: 1 %
Eosinophils Absolute: 0.4 10*3/uL (ref 0.0–0.5)
Eosinophils Relative: 6 %
HCT: 45.3 % (ref 39.0–52.0)
Hemoglobin: 15.1 g/dL (ref 13.0–17.0)
Immature Granulocytes: 0 %
Lymphocytes Relative: 36 %
Lymphs Abs: 2.3 10*3/uL (ref 0.7–4.0)
MCH: 30.6 pg (ref 26.0–34.0)
MCHC: 33.3 g/dL (ref 30.0–36.0)
MCV: 91.9 fL (ref 80.0–100.0)
Monocytes Absolute: 0.4 10*3/uL (ref 0.1–1.0)
Monocytes Relative: 7 %
Neutro Abs: 3.3 10*3/uL (ref 1.7–7.7)
Neutrophils Relative %: 50 %
Platelets: 321 10*3/uL (ref 150–400)
RBC: 4.93 MIL/uL (ref 4.22–5.81)
RDW: 13.4 % (ref 11.5–15.5)
WBC: 6.4 10*3/uL (ref 4.0–10.5)
nRBC: 0 % (ref 0.0–0.2)

## 2019-07-18 LAB — URINALYSIS, ROUTINE W REFLEX MICROSCOPIC
Bilirubin Urine: NEGATIVE
Glucose, UA: NEGATIVE mg/dL
Hgb urine dipstick: NEGATIVE
Ketones, ur: NEGATIVE mg/dL
Leukocytes,Ua: NEGATIVE
Nitrite: NEGATIVE
Protein, ur: NEGATIVE mg/dL
Specific Gravity, Urine: 1.019 (ref 1.005–1.030)
pH: 6 (ref 5.0–8.0)

## 2019-07-18 LAB — BRAIN NATRIURETIC PEPTIDE: B Natriuretic Peptide: 15.8 pg/mL (ref 0.0–100.0)

## 2019-07-18 MED ORDER — MELOXICAM 15 MG PO TABS: 15.0000 mg | ORAL_TABLET | Freq: Every day | ORAL | 0 refills | Status: DC

## 2019-07-18 MED ORDER — ACETAMINOPHEN 500 MG PO TABS
1000.0000 mg | ORAL_TABLET | Freq: Once | ORAL | Status: AC
  Administered 2019-07-18: 10:00:00 1000 mg via ORAL
  Filled 2019-07-18: qty 2

## 2019-07-18 MED ORDER — BACLOFEN 10 MG PO TABS: 10.0000 mg | ORAL_TABLET | Freq: Three times a day (TID) | ORAL | 0 refills | Status: DC

## 2019-07-18 NOTE — Discharge Instructions (Signed)
1) You appear to have a sprain injury of your anterior neck muscles on the right side.  I encourage you to ice the area several times a day, you may buy a soft cervical collar over-the-counter at any drugstore to give your next muscle some relief.  I am prescribing an anti-inflammatory medication.  You may use over-the-counter lidocaine patch 4% and apply 1 daily to the side of your neck.  If your symptoms are not improving or worsening he definitely need to see your primary care doctor this coming Monday and you may need further imaging with an MRI.  2) you have an upper respiratory infection.  You have a coronavirus send out lab pending and will be in home isolation for the next 24 to 48 hours until your tests have resulted..  If you need groceries or medications have a friend deliver them or use a delivery service.        Person Under Monitoring Name: Chad Saunders  Location: [address]  [place] [id number]   Infection Prevention Recommendations for Individuals Confirmed to have, or Being Evaluated for, 2019 Novel Coronavirus (COVID-19) Infection Who Receive Care at Home  Individuals who are confirmed to have, or are being evaluated for, COVID-19 should follow the prevention steps below until a healthcare provider or local or state health department says they can return to normal activities.  Stay home except to get medical care You should restrict activities outside your home, except for getting medical care. Do not go to work, school, or public areas, and do not use public transportation or taxis.  Call ahead before visiting your doctor Before your medical appointment, call the healthcare provider and tell them that you have, or are being evaluated for, COVID-19 infection. This will help the healthcare providers office take steps to keep other people from getting infected. Ask your healthcare provider to call the local or state health department.  Monitor your  symptoms Seek prompt medical attention if your illness is worsening (e.g., difficulty breathing). Before going to your medical appointment, call the healthcare provider and tell them that you have, or are being evaluated for, COVID-19 infection. Ask your healthcare provider to call the local or state health department.  Wear a facemask You should wear a facemask that covers your nose and mouth when you are in the same room with other people and when you visit a healthcare provider. People who live with or visit you should also wear a facemask while they are in the same room with you.  Separate yourself from other people in your home As much as possible, you should stay in a different room from other people in your home. Also, you should use a separate bathroom, if available.  Avoid sharing household items You should not share dishes, drinking glasses, cups, eating utensils, towels, bedding, or other items with other people in your home. After using these items, you should wash them thoroughly with soap and water.  Cover your coughs and sneezes Cover your mouth and nose with a tissue when you cough or sneeze, or you can cough or sneeze into your sleeve. Throw used tissues in a lined trash can, and immediately wash your hands with soap and water for at least 20 seconds or use an alcohol-based hand rub.  Wash your [employer]hands Wash your hands often and thoroughly with soap and water for at least 20 seconds. You can use an alcohol-based hand sanitizer if soap and water are not available and if  your hands are not visibly dirty. Avoid touching your eyes, nose, and mouth with unwashed hands.   Prevention Steps for Caregivers and Household Members of Individuals Confirmed to have, or Being Evaluated for, COVID-19 Infection Being Cared for in the Home  If you live with, or provide care at home for, a person confirmed to have, or being evaluated for, COVID-19 infection please follow these guidelines  to prevent infection:  Follow healthcare providers instructions Make sure that you understand and can help the patient follow any healthcare provider instructions for all care.  Provide for the patients basic needs You should help the patient with basic needs in the home and provide support for getting groceries, prescriptions, and other personal needs.  Monitor the patients symptoms If they are getting sicker, call his or her medical provider and tell them that the patient has, or is being evaluated for, COVID-19 infection. This will help the healthcare providers office take steps to keep other people from getting infected. Ask the healthcare provider to call the local or state health department.  Limit the number of people who have contact with the patient If possible, have only one caregiver for the patient. Other household members should stay in another home or place of residence. If this is not possible, they should stay in another room, or be separated from the patient as much as possible. Use a separate bathroom, if available. Restrict visitors who do not have an essential need to be in the home.  Keep older adults, very young children, and other sick people away from the patient Keep older adults, very young children, and those who have compromised immune systems or chronic health conditions away from the patient. This includes people with chronic heart, lung, or kidney conditions, diabetes, and cancer.  Ensure good ventilation Make sure that shared spaces in the home have good air flow, such as from an air conditioner or an opened window, weather permitting.  Wash your hands often Wash your hands often and thoroughly with soap and water for at least 20 seconds. You can use an alcohol based hand sanitizer if soap and water are not available and if your hands are not visibly dirty. Avoid touching your eyes, nose, and mouth with unwashed hands. Use disposable paper towels to  dry your hands. If not available, use dedicated cloth towels and replace them when they become wet.  Wear a facemask and gloves Wear a disposable facemask at all times in the room and gloves when you touch or have contact with the patients blood, body fluids, and/or secretions or excretions, such as sweat, saliva, sputum, nasal mucus, vomit, urine, or feces.  Ensure the mask fits over your nose and mouth tightly, and do not touch it during use. Throw out disposable facemasks and gloves after using them. Do not reuse. Wash your hands immediately after removing your facemask and gloves. If your personal clothing becomes contaminated, carefully remove clothing and launder. Wash your hands after handling contaminated clothing. Place all used disposable facemasks, gloves, and other waste in a lined container before disposing them with other household waste. Remove gloves and wash your hands immediately after handling these items.  Do not share dishes, glasses, or other household items with the patient Avoid sharing household items. You should not share dishes, drinking glasses, cups, eating utensils, towels, bedding, or other items with a patient who is confirmed to have, or being evaluated for, COVID-19 infection. After the person uses these items, you should wash them thoroughly  with soap and water.  Wash laundry thoroughly Immediately remove and wash clothes or bedding that have blood, body fluids, and/or secretions or excretions, such as sweat, saliva, sputum, nasal mucus, vomit, urine, or feces, on them. Wear gloves when handling laundry from the patient. Read and follow directions on labels of laundry or clothing items and detergent. In general, wash and dry with the warmest temperatures recommended on the label.  Clean all areas the individual has used often Clean all touchable surfaces, such as counters, tabletops, doorknobs, bathroom fixtures, toilets, phones, keyboards, tablets, and bedside  tables, every day. Also, clean any surfaces that may have blood, body fluids, and/or secretions or excretions on them. Wear gloves when cleaning surfaces the patient has come in contact with. Use a diluted bleach solution (e.g., dilute bleach with 1 part bleach and 10 parts water) or a household disinfectant with a label that says EPA-registered for coronaviruses. To make a bleach solution at home, add 1 tablespoon of bleach to 1 quart (4 cups) of water. For a larger supply, add  cup of bleach to 1 gallon (16 cups) of water. Read labels of cleaning products and follow recommendations provided on product labels. Labels contain instructions for safe and effective use of the cleaning product including precautions you should take when applying the product, such as wearing gloves or eye protection and making sure you have good ventilation during use of the product. Remove gloves and wash hands immediately after cleaning.  Monitor yourself for signs and symptoms of illness Caregivers and household members are considered close contacts, should monitor their health, and will be asked to limit movement outside of the home to the extent possible. Follow the monitoring steps for close contacts listed on the symptom monitoring form.   ? If you have additional questions, contact your local health department or call the epidemiologist on call at [phone] (available 24/7). ? This guidance is subject to change. For the most up-to-date guidance from [facility], please refer to their website: TripMetro.huhttps://www.cdc.gov/coronavirus/2019-ncov/hcp/guidance-prevent-spread.html

## 2019-07-18 NOTE — ED Provider Notes (Signed)
[facility]-EMERGENCY DEPT Provider Note   CSN: [id number] Arrival date & time: 07/18/19  [id number]     History   Chief Complaint Chief Complaint  Patient presents with  . Shortness of Breath  . Shoulder Pain    HPI Chad Saunders is a 48 y.o. male who presents emergency department with chief complaint of URI symptoms and right-sided neck pain.  Patient states that all of his symptoms began about 1 week ago and have been progressively worsening.  He complains of pain on the right side of his neck which is worse with deep breathing, right lateral rotation, right neck flexion, better when he keeps his neck still.  The pain radiates over the right shoulder and into the deltoid region.  It does not extend down his arm any further and does not involve his hands.  He denies numbness, tingling, weakness.  He denies any injury, recent heavy lifting.  Patient also complains of stuffy nose, headache, fatigue and mild cough.  He has not been running fevers and has been taking his temperature daily.  He was exposed to coronavirus with his daughter about 2 weeks ago.  He states that he feels short of breath because it hurts for him to take the deep breath in his neck.     HPI  Past Medical History:  Diagnosis Date  . Hypertension     There are no active problems to display for this patient.   Past Surgical History:  Procedure Laterality Date  . BACK SURGERY          Home Medications    Prior to Admission medications   Medication Sig Start Date End Date Taking? Authorizing Provider  cyclobenzaprine (FLEXERIL) 10 MG tablet Take 1 tablet (10 mg total) by mouth 2 (two) times daily as needed for muscle spasms. 10/04/12   Powers, Jules HusbandsMarwan T, MD  HYDROcodone-acetaminophen (NORCO/VICODIN) 5-325 MG tablet Take 1 tablet by mouth every 4 (four) hours as needed. 08/12/16   Barrett HenleNadeau, Nicole Elizabeth, PA-C  ibuprofen (ADVIL,MOTRIN) 600 MG tablet Take 1 tablet (600 mg total) by mouth every  6 (six) hours as needed for pain. 10/04/12   Powers, Jules HusbandsMarwan T, MD  oxyCODONE-acetaminophen (PERCOCET) 5-325 MG per tablet Take 1 tablet by mouth every 6 (six) hours as needed for pain. 10/04/12   Powers, Jules HusbandsMarwan T, MD    Family History No family history on file.  Social History Social History   Tobacco Use  . Smoking status: Current Every Day Smoker  . Smokeless tobacco: Never Used  Substance Use Topics  . Alcohol use: Yes  . Drug use: No     Allergies   Patient has no known allergies.   Review of Systems Review of Systems Ten systems reviewed and are negative for acute change, except as noted in the HPI.    Physical Exam Updated Vital Signs BP (!) 159/96 (BP Location: Right Arm)   Pulse 94   Temp 98.7 F (37.1 C) (Oral)   Resp 18   SpO2 95%   Physical Exam Vitals signs and nursing note reviewed.  Constitutional:      General: He is not in acute distress.    Appearance: He is well-developed. He is not diaphoretic.  HENT:     Head: Normocephalic and atraumatic.  Eyes:     General: No scleral icterus.    Conjunctiva/sclera: Conjunctivae normal.  Neck:     Musculoskeletal: Normal range of motion and neck supple.  Cardiovascular:  Rate and Rhythm: Normal rate and regular rhythm.     Heart sounds: Normal heart sounds.  Pulmonary:     Effort: Pulmonary effort is normal. No respiratory distress.     Breath sounds: Normal breath sounds.  Abdominal:     Palpations: Abdomen is soft.     Tenderness: There is no abdominal tenderness.  Musculoskeletal:     Comments: Tender to palpation along the insertion site of the right first and second scalene, pain with right lateral neck flexion and right lateral rotation.  Normal shoulder and arm strength.  Normal grip strength bilaterally. Normal sensation   Skin:    General: Skin is warm and dry.  Neurological:     Mental Status: He is alert.  Psychiatric:        Behavior: Behavior normal.      ED Treatments /  Results  Labs (all labs ordered are listed, but only abnormal results are displayed) Labs Reviewed  COMPREHENSIVE METABOLIC PANEL  CBC WITH DIFFERENTIAL/PLATELET  BRAIN NATRIURETIC PEPTIDE  URINALYSIS, ROUTINE W REFLEX MICROSCOPIC    EKG EKG Interpretation  Date/Time:  Friday July 18 2019 08:50:15 EDT Ventricular Rate:  90 PR Interval:    QRS Duration: 92 QT Interval:  333 QTC Calculation: 408 R Axis:   79 Text Interpretation:  Sinus rhythm No old tracing to compare Confirmed by Daleen Bo [phone]) on 07/18/2019 8:52:31 AM   Radiology Dg Chest Port 1 View  Result Date: 07/18/2019 CLINICAL DATA:  Shortness of breath. EXAM: PORTABLE CHEST 1 VIEW COMPARISON:  None. FINDINGS: The heart size and mediastinal contours are within normal limits. Both lungs are clear. No pneumothorax or pleural effusion is noted. The visualized skeletal structures are unremarkable. IMPRESSION: No active disease. Electronically Signed   By: Marijo Conception M.D.   On: 07/18/2019 09:44    Procedures Procedures (including critical care time)  Medications Ordered in ED Medications - No data to display   Initial Impression / Assessment and Plan / ED Course  I have reviewed the triage vital signs and the nursing notes.  Pertinent labs & imaging results that were available during my care of the patient were reviewed by me and considered in my medical decision making (see chart for details).        Patient here with symptoms of right shoulder pain and URI.  He is hemodynamically stable without hypoxia and I have given him a novel corona test outpatient send out lab with home isolation guides.  I personally reviewed the patient's work-up which shows negative BNP, normal urinalysis, CMP shows slightly low calcium CBC without abnormality and without leukocytosis.  I feel that the patient's neck pain is secondary to strain specifically of the scalene region.  He is advised to use supportive care, and NSAIDs  and muscle relaxers and may follow-up with his primary care for any worsening symptoms.  He appears otherwise appropriate for discharge at this time.  Discussed return precautions and home isolation guidance   Chad Saunders was evaluated in Emergency Department on 07/18/2019 for the symptoms described in the history of present illness. He was evaluated in the context of the global COVID-19 pandemic, which necessitated consideration that the patient might be at risk for infection with the SARS-CoV-2 virus that causes COVID-19. Institutional protocols and algorithms that pertain to the evaluation of patients at risk for COVID-19 are in a state of rapid change based on information released by regulatory bodies including the CDC and federal and state organizations. These  policies and algorithms were followed during the patient's care in the ED.  Final Clinical Impressions(s) / ED Diagnoses   Final diagnoses:  SOB (shortness of breath)    ED Discharge Orders    None       Arthor CaptainHarris, Hellena Pridgen, PA-C 07/18/19 [id number]    Mancel BaleWentz, Elliott, MD 07/24/19 [phone]

## 2019-07-18 NOTE — ED Triage Notes (Addendum)
Pt complains of shortness of breath for the past 2 days, right shoulder and chest pain x 2 weeks. The pain started while he was asleep.  Pt's daughter was diagnosed with COVID earlier this week, pt states he was in contact with patient while she was sick.

## 2019-07-19 LAB — NOVEL CORONAVIRUS, NAA (HOSP ORDER, SEND-OUT TO REF LAB; TAT 18-24 HRS): SARS-CoV-2, NAA: NOT DETECTED

## 2020-01-21 ENCOUNTER — Encounter (HOSPITAL_COMMUNITY): Payer: Self-pay

## 2020-01-21 ENCOUNTER — Other Ambulatory Visit: Payer: Self-pay

## 2020-01-21 ENCOUNTER — Emergency Department (HOSPITAL_COMMUNITY)
Admission: EM | Admit: 2020-01-21 | Discharge: 2020-01-21 | Disposition: A | Discharge status: Home / Self Care | Payer: BC Managed Care – PPO | Attending: Emergency Medicine | Admitting: Emergency Medicine

## 2020-01-21 ENCOUNTER — Emergency Department (HOSPITAL_COMMUNITY): Payer: BC Managed Care – PPO

## 2020-01-21 DIAGNOSIS — F17210 Nicotine dependence, cigarettes, uncomplicated: Secondary | ICD-10-CM | POA: Diagnosis not present

## 2020-01-21 DIAGNOSIS — R1032 Left lower quadrant pain: Secondary | ICD-10-CM | POA: Insufficient documentation

## 2020-01-21 DIAGNOSIS — K921 Melena: Secondary | ICD-10-CM | POA: Diagnosis present

## 2020-01-21 DIAGNOSIS — Z79899 Other long term (current) drug therapy: Secondary | ICD-10-CM | POA: Insufficient documentation

## 2020-01-21 DIAGNOSIS — K4090 Unilateral inguinal hernia, without obstruction or gangrene, not specified as recurrent: Secondary | ICD-10-CM | POA: Diagnosis not present

## 2020-01-21 DIAGNOSIS — I10 Essential (primary) hypertension: Secondary | ICD-10-CM | POA: Diagnosis not present

## 2020-01-21 LAB — CBC
HCT: 47.1 % (ref 39.0–52.0)
Hemoglobin: 15.7 g/dL (ref 13.0–17.0)
MCH: 30.3 pg (ref 26.0–34.0)
MCHC: 33.3 g/dL (ref 30.0–36.0)
MCV: 90.9 fL (ref 80.0–100.0)
Platelets: 342 10*3/uL (ref 150–400)
RBC: 5.18 MIL/uL (ref 4.22–5.81)
RDW: 13.4 % (ref 11.5–15.5)
WBC: 7.9 10*3/uL (ref 4.0–10.5)
nRBC: 0 % (ref 0.0–0.2)

## 2020-01-21 LAB — POC OCCULT BLOOD, ED: Fecal Occult Bld: POSITIVE — AB

## 2020-01-21 LAB — COMPREHENSIVE METABOLIC PANEL
ALT: 28 U/L (ref 0–44)
AST: 25 U/L (ref 15–41)
Albumin: 4.4 g/dL (ref 3.5–5.0)
Alkaline Phosphatase: 58 U/L (ref 38–126)
Anion gap: 8 (ref 5–15)
BUN: 13 mg/dL (ref 6–20)
CO2: 23 mmol/L (ref 22–32)
Calcium: 9.1 mg/dL (ref 8.9–10.3)
Chloride: 107 mmol/L (ref 98–111)
Creatinine, Ser: 1.17 mg/dL (ref 0.61–1.24)
GFR calc Af Amer: >60 mL/min (ref >60)
GFR calc non Af Amer: >60 mL/min (ref >60)
Glucose, Bld: 90 mg/dL (ref 70–99)
Potassium: 3.7 mmol/L (ref 3.5–5.1)
Sodium: 138 mmol/L (ref 135–145)
Total Bilirubin: 1 mg/dL (ref 0.3–1.2)
Total Protein: 7.9 g/dL (ref 6.5–8.1)

## 2020-01-21 LAB — TYPE AND SCREEN
ABO/RH(D): O POS
Antibody Screen: NEGATIVE

## 2020-01-21 LAB — URINALYSIS, ROUTINE W REFLEX MICROSCOPIC
Bilirubin Urine: NEGATIVE
Glucose, UA: NEGATIVE mg/dL
Hgb urine dipstick: NEGATIVE
Ketones, ur: NEGATIVE mg/dL
Leukocytes,Ua: NEGATIVE
Nitrite: NEGATIVE
Protein, ur: NEGATIVE mg/dL
Specific Gravity, Urine: 1.034 — ABNORMAL HIGH (ref 1.005–1.030)
pH: 5 (ref 5.0–8.0)

## 2020-01-21 LAB — LIPASE, BLOOD: Lipase: 45 U/L (ref 11–51)

## 2020-01-21 MED ORDER — SODIUM CHLORIDE 0.9 % IV BOLUS
1000.0000 mL | Freq: Once | INTRAVENOUS | Status: AC
  Administered 2020-01-21: 1000 mL via INTRAVENOUS

## 2020-01-21 MED ORDER — PANTOPRAZOLE SODIUM 40 MG PO TBEC: 40.0000 mg | DELAYED_RELEASE_TABLET | Freq: Every day | ORAL | 0 refills | Status: DC

## 2020-01-21 MED ORDER — SODIUM CHLORIDE (PF) 0.9 % IJ SOLN
INTRAMUSCULAR | Status: AC
  Filled 2020-01-21: qty 50

## 2020-01-21 MED ORDER — SODIUM CHLORIDE 0.9 % IV SOLN
80.0000 mg | Freq: Once | INTRAVENOUS | Status: AC
  Administered 2020-01-21: 80 mg via INTRAVENOUS
  Filled 2020-01-21: qty 80

## 2020-01-21 MED ORDER — METHOCARBAMOL 500 MG PO TABS: 500.0000 mg | ORAL_TABLET | Freq: Three times a day (TID) | ORAL | 0 refills | Status: DC | PRN

## 2020-01-21 MED ORDER — PANTOPRAZOLE SODIUM 40 MG IV SOLR: 40.0000 mg | Freq: Two times a day (BID) | INTRAVENOUS | Status: DC

## 2020-01-21 MED ORDER — HYDROMORPHONE HCL 1 MG/ML IJ SOLN
1.0000 mg | Freq: Once | INTRAMUSCULAR | Status: AC
  Administered 2020-01-21: 1 mg via INTRAVENOUS
  Filled 2020-01-21: qty 1

## 2020-01-21 MED ORDER — SUCRALFATE 1 GM/10ML PO SUSP: 1.0000 g | Freq: Three times a day (TID) | ORAL | 0 refills | Status: DC

## 2020-01-21 MED ORDER — ONDANSETRON HCL 4 MG/2ML IJ SOLN
4.0000 mg | Freq: Once | INTRAMUSCULAR | Status: AC
  Administered 2020-01-21: 4 mg via INTRAVENOUS
  Filled 2020-01-21: qty 2

## 2020-01-21 MED ORDER — MORPHINE SULFATE (PF) 4 MG/ML IV SOLN
4.0000 mg | Freq: Once | INTRAVENOUS | Status: AC
  Administered 2020-01-21: 4 mg via INTRAVENOUS
  Filled 2020-01-21: qty 1

## 2020-01-21 MED ORDER — SODIUM CHLORIDE 0.9 % IV SOLN
8.0000 mg/h | INTRAVENOUS | Status: DC
  Administered 2020-01-21: 8 mg/h via INTRAVENOUS
  Filled 2020-01-21 (×2): qty 80

## 2020-01-21 MED ORDER — IOHEXOL 300 MG/ML  SOLN
100.0000 mL | Freq: Once | INTRAMUSCULAR | Status: AC | PRN
  Administered 2020-01-21: 100 mL via INTRAVENOUS

## 2020-01-21 NOTE — ED Provider Notes (Signed)
[facility]-EMERGENCY DEPT Provider Note   CSN: [id number] Arrival date & time: 01/21/20  1726     History Chief Complaint  Patient presents with  . Abdominal Pain  . Blood In Stools    Chad Saunders is a 49 y.o. male with a history of tobacco abuse and hypertension who presents to the emergency department with complaints of abdominal pain that began 2 hours prior to arrival.  Patient states the pain is located in his left lower quadrant of the abdomen, radiates to the back and into the testicle.  States it is constant, crampy in nature, currently an 8 out of 10 in severity, no alleviating factors, worse with movement and certain positions.  He states he thinks the pain may be related to his inguinal hernia, he has had this for several years, has not had surgical consultation for this, he states he occasionally gets pain and just reduces the area.  The area is reducible today however the pain is much worse and persistent.  He notes that about an hour prior to arrival he had a bowel movement that was somewhat loose with maroon-colored blood.  He has had similar bowel movements with less blood over the past 1 week, seems to be more volume this this evening.  He has felt a bit nauseated since onset of discomfort.  Also mentions some urinary frequency and urgency which is not a new problem.  Denies fever, chills, emesis, chest pain, dyspnea, syncope, pain with bowel movement, dysuria, hematuria, or hematemesis.  Patient states that he takes about 600 mg of Motrin daily.  He also states that he drinks about 2 beers per day.  Denies blood thinner use.  HPI     Past Medical History:  Diagnosis Date  . Hypertension     There are no problems to display for this patient.   Past Surgical History:  Procedure Laterality Date  . BACK SURGERY         Family History  Problem Relation Age of Onset  . Diabetes Mother   . Hypertension Father     Social History   Tobacco  Use  . Smoking status: Current Every Day Smoker    Packs/day: 0.50    Types: Cigarettes  . Smokeless tobacco: Never Used  Substance Use Topics  . Alcohol use: Yes  . Drug use: No    Home Medications Prior to Admission medications   Medication Sig Start Date End Date Taking? Authorizing Provider  acetaminophen (TYLENOL) 325 MG tablet Take 650 mg by mouth every 6 (six) hours as needed for mild pain or headache.    [provider]  baclofen (LIORESAL) 10 MG tablet Take 1 tablet (10 mg total) by mouth 3 (three) times daily. 07/18/19   Arthor Captain, PA-C  meloxicam (MOBIC) 15 MG tablet Take 1 tablet (15 mg total) by mouth daily. Take 1 daily with food. 07/18/19   Harris, Abigail, PA-C  olmesartan (BENICAR) 40 MG tablet Take 40 mg by mouth daily. 05/27/19   [provider]  oxyCODONE-acetaminophen (PERCOCET) 10-325 MG tablet Take 1 tablet by mouth 4 (four) times daily as needed for pain.  07/02/19   [provider]  oxyCODONE-acetaminophen (PERCOCET) 5-325 MG per tablet Take 1 tablet by mouth every 6 (six) hours as needed for pain. Patient not taking: Reported on 07/18/2019 10/04/12   Powers, Jules Husbands, MD    Allergies    Patient has no known allergies.  Review of Systems   Review  of Systems  Constitutional: Negative for chills and fever.  Respiratory: Negative for shortness of breath.   Cardiovascular: Negative for chest pain.  Gastrointestinal: Positive for abdominal pain, blood in stool, diarrhea and nausea. Negative for constipation, rectal pain and vomiting.  Genitourinary: Positive for frequency, testicular pain and urgency. Negative for discharge, dysuria, hematuria, penile pain and penile swelling.  Neurological: Negative for syncope.  All other systems reviewed and are negative.   Physical Exam Updated Vital Signs BP (!) 174/97 (BP Location: Right Arm)   Pulse 83   Temp 98.2 F (36.8 C) (Oral)   Resp 18   Ht 6' (1.829 m)   Wt 99.8 kg   SpO2 100%    BMI 29.84 kg/m   Physical Exam Vitals and nursing note reviewed. Exam conducted with a chaperone present.  Constitutional:      General: He is not in acute distress.    Appearance: He is well-developed. He is not toxic-appearing.  HENT:     Head: Normocephalic and atraumatic.  Eyes:     General:        Right eye: No discharge.        Left eye: No discharge.     Conjunctiva/sclera: Conjunctivae normal.  Cardiovascular:     Rate and Rhythm: Normal rate and regular rhythm.  Pulmonary:     Effort: Pulmonary effort is normal. No respiratory distress.     Breath sounds: Normal breath sounds. No wheezing, rhonchi or rales.  Abdominal:     General: There is no distension.     Palpations: Abdomen is soft.     Tenderness: There is abdominal tenderness (most prominent in LLQ & L suprapubic region) in the epigastric area, periumbilical area, suprapubic area, left upper quadrant and left lower quadrant. There is no guarding or rebound. Negative signs include Murphy's sign and McBurney's sign.     Hernia: A hernia is present. Hernia is present in the left inguinal area (reducible). There is no hernia in the right inguinal area.  Genitourinary:    Penis: Circumcised. No erythema, tenderness, discharge, swelling or lesions.      Testes:        Right: Mass or tenderness not present.        Left: Tenderness present. Mass not present.     Rectum: Guaiac result positive. No external hemorrhoid.     Comments: DRE with minimal stool present, appears brown,- no obvious melena, no bright red blood. Musculoskeletal:     Cervical back: Neck supple.  Skin:    General: Skin is warm and dry.     Findings: No rash.  Neurological:     Mental Status: He is alert.     Comments: Clear speech.   Psychiatric:        Behavior: Behavior normal.     ED Results / Procedures / Treatments   Labs (all labs ordered are listed, but only abnormal results are displayed) Labs Reviewed  URINALYSIS, ROUTINE W  REFLEX MICROSCOPIC - Abnormal; Notable for the following components:      Result Value   Specific Gravity, Urine 1.034 (*)    All other components within normal limits  POC OCCULT BLOOD, ED - Abnormal; Notable for the following components:   Fecal Occult Bld POSITIVE (*)    All other components within normal limits  URINE CULTURE  COMPREHENSIVE METABOLIC PANEL  CBC  LIPASE, BLOOD  TYPE AND SCREEN  ABO/RH    EKG None  Radiology CT Abdomen Pelvis W Contrast  Result Date: 01/21/2020 CLINICAL DATA:  Left lower quadrant pain EXAM: CT ABDOMEN AND PELVIS WITH CONTRAST TECHNIQUE: Multidetector CT imaging of the abdomen and pelvis was performed using the standard protocol following bolus administration of intravenous contrast. CONTRAST:  OMNIPAQUE IOHEXOL 300 MG/ML  SOLN COMPARISON:  CT 08/25/2011 FINDINGS: Lower chest: Lung bases demonstrate no acute consolidation or effusion. The heart size is normal. Hepatobiliary: No focal liver abnormality is seen. No gallstones, gallbladder wall thickening, or biliary dilatation. Pancreas: Unremarkable. No pancreatic ductal dilatation or surrounding inflammatory changes. Spleen: Normal in size without focal abnormality. Adrenals/Urinary Tract: Adrenal glands are unremarkable. Kidneys are normal, without renal calculi, focal lesion, or hydronephrosis. Bladder is unremarkable. Stomach/Bowel: Stomach is within normal limits. Appendix appears normal. No evidence of bowel wall thickening, distention, or inflammatory changes. Vascular/Lymphatic: No significant vascular findings are present. No enlarged abdominal or pelvic lymph nodes. Reproductive: Prostate is unremarkable. Other: Negative for free air or free fluid. Small fat containing right inguinal hernia. Moderate left inguinal hernia containing fat and small bowel. This is increased compared to 2012 CT. Tiny fluid in the hernia sac. No soft tissue stranding. No obstruction. Musculoskeletal: No acute or  significant osseous findings. IMPRESSION: 1. Moderate left inguinal hernia containing mesenteric fat and small bowel, this is increased in size compared to 2012 CT. Tiny fluid in the left hernia sac but no inflammatory changes or bowel wall thickening to suggest incarceration. Negative for upstream obstruction. Electronically Signed   By: Jasmine Pang M.D.   On: 01/21/2020 19:48    Procedures Procedures (including critical care time)  Medications Ordered in ED Medications  sodium chloride (PF) 0.9 % injection (has no administration in time range)  morphine 4 MG/ML injection 4 mg (4 mg Intravenous Given 01/21/20 1842)  ondansetron (ZOFRAN) injection 4 mg (4 mg Intravenous Given 01/21/20 1840)  sodium chloride 0.9 % bolus 1,000 mL (0 mLs Intravenous Stopped 01/21/20 1956)  pantoprazole (PROTONIX) 80 mg in sodium chloride 0.9 % 100 mL IVPB (80 mg Intravenous New Bag/Given 01/21/20 1847)  iohexol (OMNIPAQUE) 300 MG/ML solution 100 mL (100 mLs Intravenous Contrast Given 01/21/20 1936)  HYDROmorphone (DILAUDID) injection 1 mg (1 mg Intravenous Given 01/21/20 2020)    ED Course  I have reviewed the triage vital signs and the nursing notes.  Pertinent labs & imaging results that were available during my care of the patient were reviewed by me and considered in my medical decision making (see chart for details).    MDM Rules/Calculators/A&P                     Patient presents to the emergency department with complaints of abdominal pain for the past 2 hours as well as some blood in stool for the past 1 week that worsened prior to arrival.  Patient nontoxic-appearing, resting comfortably, vitals WNL with exception of elevated blood pressure, doubt HTN emergency.  Patient is tender to palpation primarily in the left lower quadrant of the abdomen as well as to the mid abdomen & to the L groin/testicle.  No obvious torsion.  DRE minimal soft brown stool, no melena or bright red blood.  Plan for labs, CT  abdomen/pelvis, analgesics, Protonix.  Fecal occult: Positive CBC: No anemia.  Hemoglobin and hematocrit within normal limits.  No leukocytosis.  Normal platelets. CMP: Unremarkable, no electrolyte derangement, renal dysfunction, or abnormalities with liver function test. Urinalysis: No UTI. CT abdomen/pelvis:1. Moderate left inguinal hernia containing mesenteric fat and small bowel, this is increased  in size compared to 2012 CT. Tiny fluid in the left hernia sac but no inflammatory changes or bowel wall thickening to suggest incarceration. Negative for upstream obstruction.   Work-up notable for increasing size of his left inguinal hernia without incarceration/obstruction.  He is fecal occult positive, however has a stable hemoglobin/hematocrit, BUN WNL therefore doubt significant upper GI bleeding, no melena/hematochezia/bright red blood per rectum.  Overall reassuring work-up.  Will start patient on Protonix and Carafate for fecal occult positive stool and have him follow-up with gastroenterology, discussed stopping ibuprofen use and trying to decrease alcohol use.  Will trial Robaxin to help with pain related to his hernia and have him follow-up closely with general surgery.  Discussed no driving or operating heavy machinery when taking Robaxin. I discussed results, treatment plan, need for follow-up, and return precautions with the patient. Provided opportunity for questions, patient confirmed understanding and is in agreement with plan.   Findings and plan of care discussed with supervising physician Dr. Stevie Kern who is in agreement.    Final Clinical Impression(s) / ED Diagnoses Final diagnoses:  Blood in stool  Left inguinal hernia    Rx / DC Orders ED Discharge Orders         Ordered    pantoprazole (PROTONIX) 40 MG tablet  Daily     01/21/20 2158    sucralfate (CARAFATE) 1 GM/10ML suspension  3 times daily with meals & bedtime     01/21/20 2158    methocarbamol (ROBAXIN) 500 MG  tablet  Every 8 hours PRN     01/21/20 2158           Cherly Anderson, PA-C 01/21/20 2159    Milagros Loll, MD 01/22/20 1250

## 2020-01-21 NOTE — Discharge Instructions (Addendum)
You were seen in the emergency department today for abdominal pain and blood in your stool.  Your CT scan showed findings consistent with a hernia that is not causing obstruction.  There was blood in your stool but your hemoglobin and hematocrit (blood counts) appear appropriate.  We are starting you on Protonix to take once per day prior to meals and Carafate to take prior to meals and prior to bedtime to help with stomach acidity and with blood in your stool.  Please take these as prescribed.  Please follow attached diet guidelines.  Please stop taking ibuprofen or other NSAIDs (Motrin, Advil, Aleve, naproxen, Mobic, Goody powder etc.) as this further irritate the stomach and can cause GI bleeding.  Please also try to decrease alcohol intake as this can be contributory as well.  We would like you to follow-up with a gastroenterology doctor within the next 3 days, please call [facility] tomorrow to make an appointment.   We are starting on Robaxin, muscle relaxant to try to help with the pain related to your hernia.  - Robaxin is the muscle relaxer I have prescribed, this is meant to help with muscle tightness. Be aware that this medication may make you drowsy therefore the first time you take this it should be at a time you are in an environment where you can rest. Do not drive or operate heavy machinery when taking this medication. Do not drink alcohol or take other sedating medications with this medicine such as narcotics or benzodiazepines.   You make take Tylenol per over the counter dosing with these medications.   We have prescribed you new medication(s) today. Discuss the medications prescribed today with your pharmacist as they can have adverse effects and interactions with your other medicines including over the counter and prescribed medications. Seek medical evaluation if you start to experience new or abnormal symptoms after taking one of these medicines, seek care immediately if you start  to experience difficulty breathing, feeling of your throat closing, facial swelling, or rash as these could be indications of a more serious allergic reaction  We would like you to follow-up with general surgery within 3 days to further discuss your hernia.  Please try to avoid heavy lifting.  Please follow-up with general surgery and GI as discussed above.  You may also follow-up with your primary care provider.  Return to the ER for new or worsening symptoms including but not limited to worsening pain, inability to have a bowel movement, increased bleeding, dark or tarry stools, lightheadedness, dizziness, passing out, chest pain, trouble breathing, inability to keep fluids down, or any other concerns.

## 2020-01-21 NOTE — ED Notes (Signed)
Spoke with Sam, PA about pt's BP being elevated. No new orders. Also noted that pt is wanting to drive himself back to work. Pt is explained at this time that he can't drive nor work because of the medication that he received here in the ED. Pt is discharged to the lobby and is attempting to contact a ride from a family member.

## 2020-01-21 NOTE — ED Notes (Signed)
Pt given urinal to provide urine specimen

## 2020-01-21 NOTE — ED Triage Notes (Signed)
Patient states he has left inguinal hernia that has been hurting worse than normal. Patient states he saw bright red blood with clots in the toilet today.

## 2020-01-22 LAB — URINE CULTURE: Culture: NO GROWTH

## 2020-01-22 LAB — ABO/RH: ABO/RH(D): O POS

## 2020-07-20 IMAGING — CT CT ABD-PELV W/ CM
2 of 5 series · 16 of 46 positions shown, 18 images · IV contrast (omnipaque)
Comparison: CT 08/25/2011

CLINICAL DATA: Left lower quadrant pain

EXAM:
CT ABDOMEN AND PELVIS WITH CONTRAST
TECHNIQUE: Multidetector CT imaging of the abdomen and pelvis was performed
using the standard protocol following bolus administration of
intravenous contrast.
CONTRAST:  100mL OMNIPAQUE IOHEXOL 300 MG/ML  SOLN

[Series 2: axial st · axial · 0.71mm/px · z∈[-611,-206]mm · 13 of 95 slices shown, 15 images]
[im 7/95  soft-tissue]
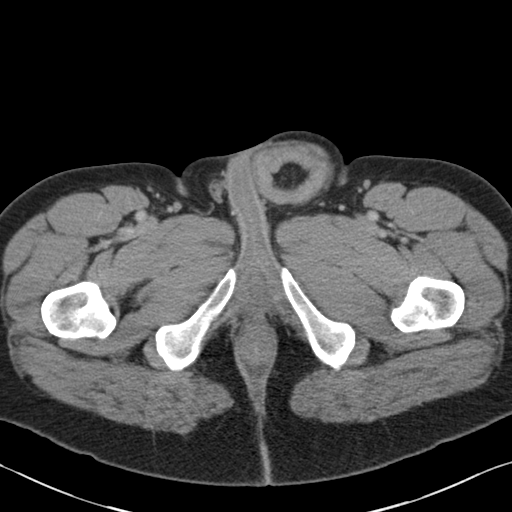
[im 7/95  bone]
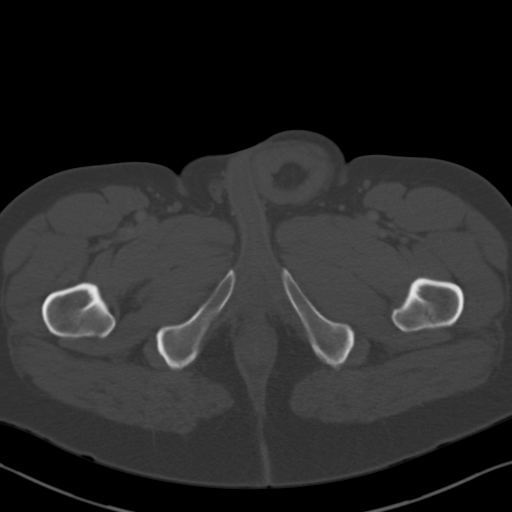
[im 13/95  soft-tissue]
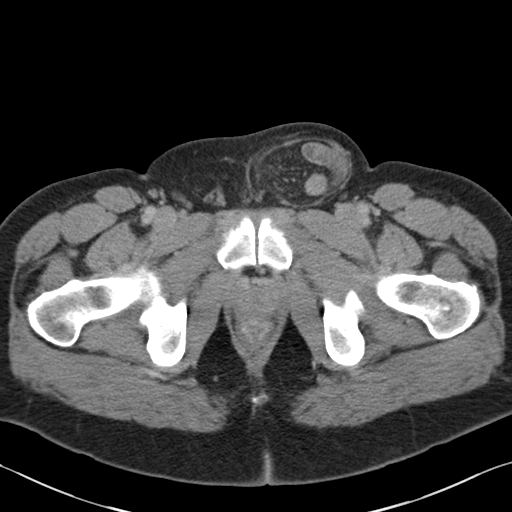
[im 19/95  soft-tissue]
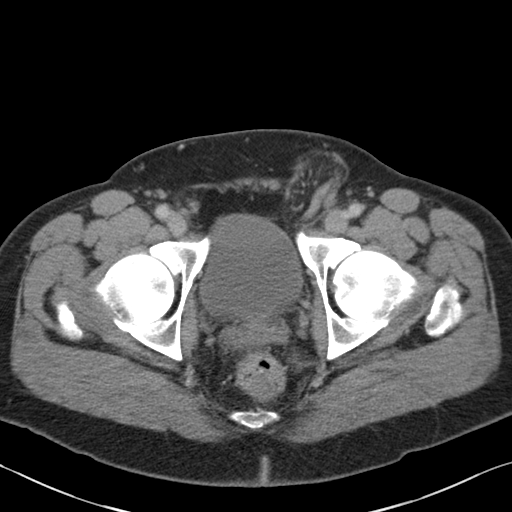
[im 26/95  soft-tissue]
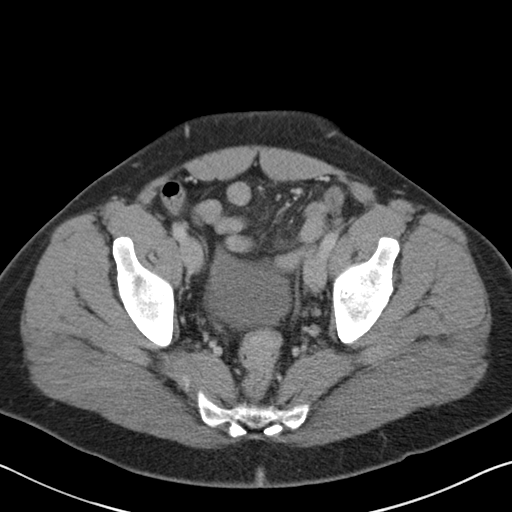
[im 32/95  soft-tissue]
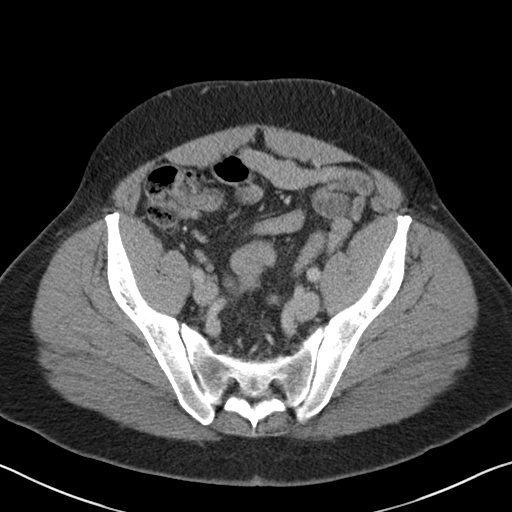
[im 38/95  soft-tissue]
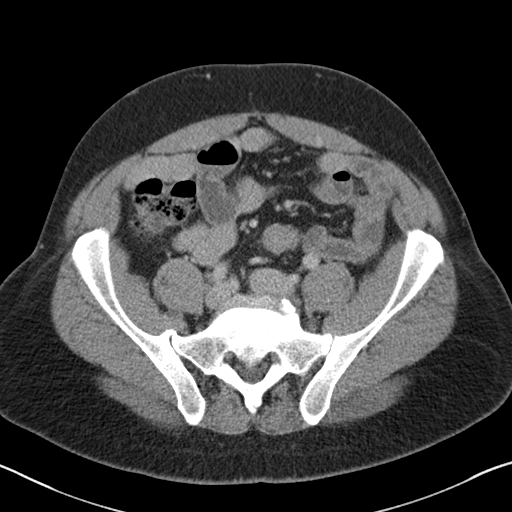
[im 51/95  soft-tissue]
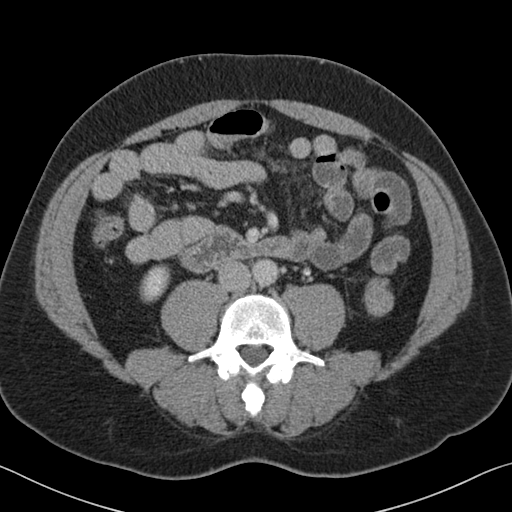
[im 57/95  soft-tissue]
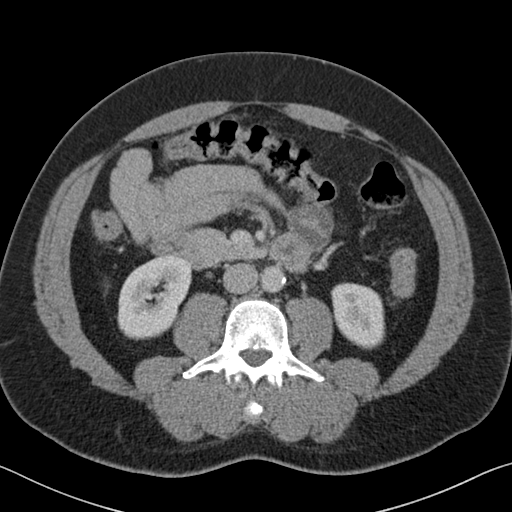
[im 63/95  soft-tissue]
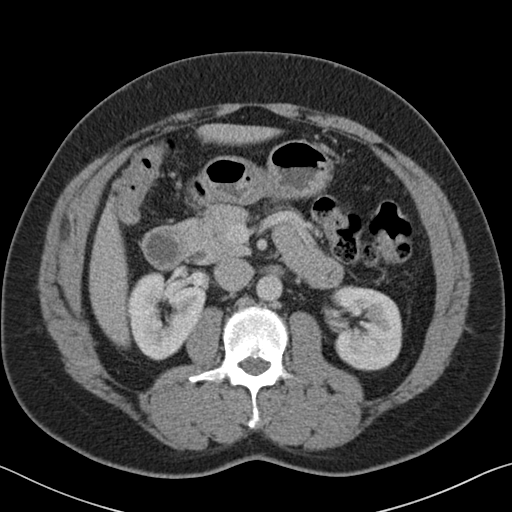
[im 63/95  bone]
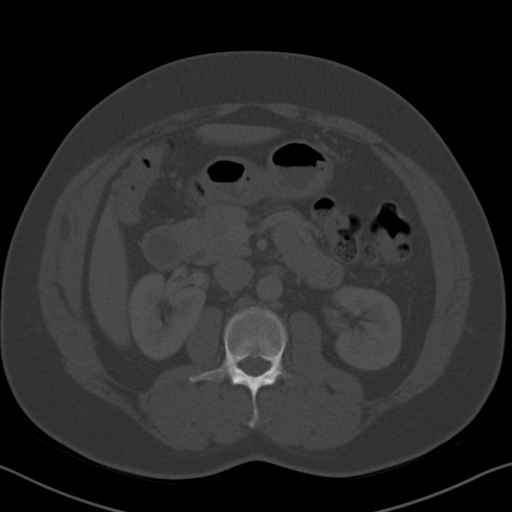
[im 69/95  soft-tissue]
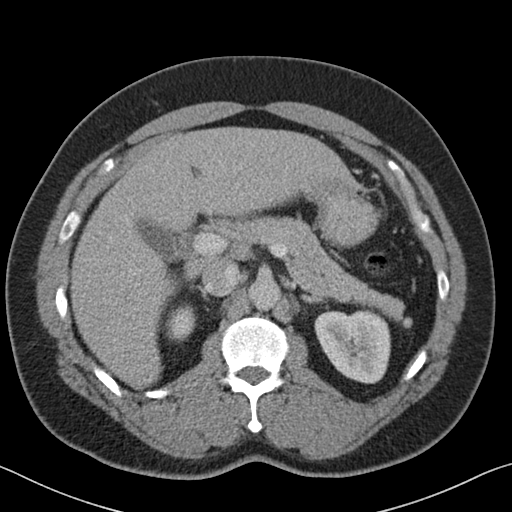
[im 76/95  soft-tissue]
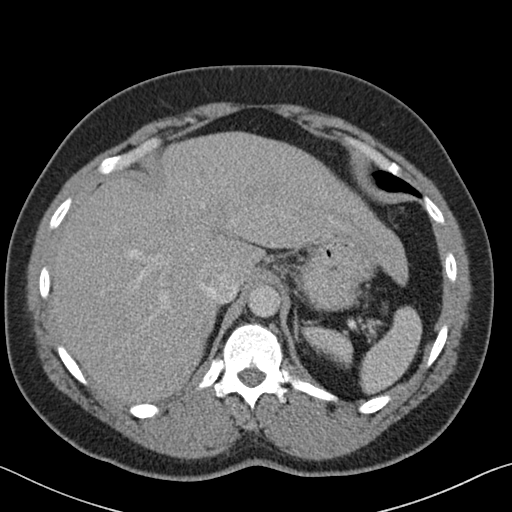
[im 82/95  soft-tissue]
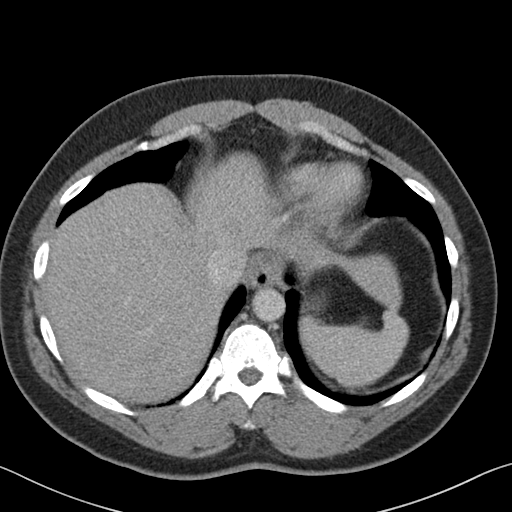
[im 88/95  soft-tissue]
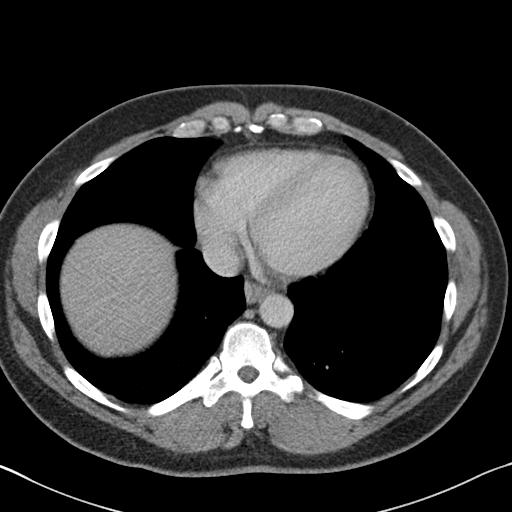

[Series 5: coronal st · coronal · 0.71mm/px · 3 of 155 slices shown]
[im 52/155  soft-tissue]
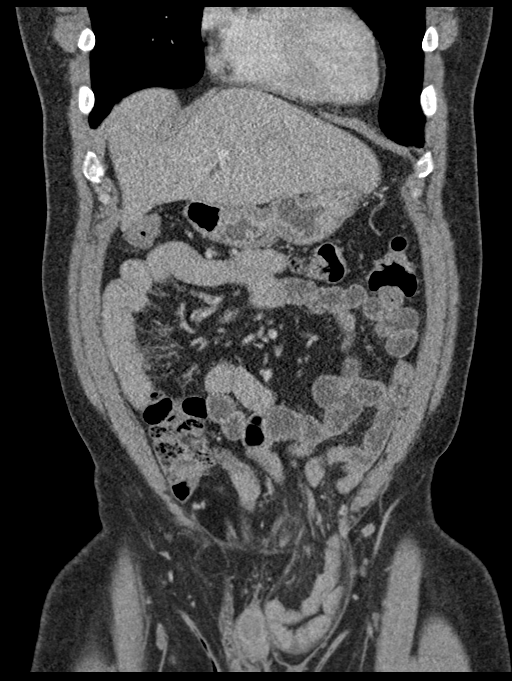
[im 69/155  soft-tissue]
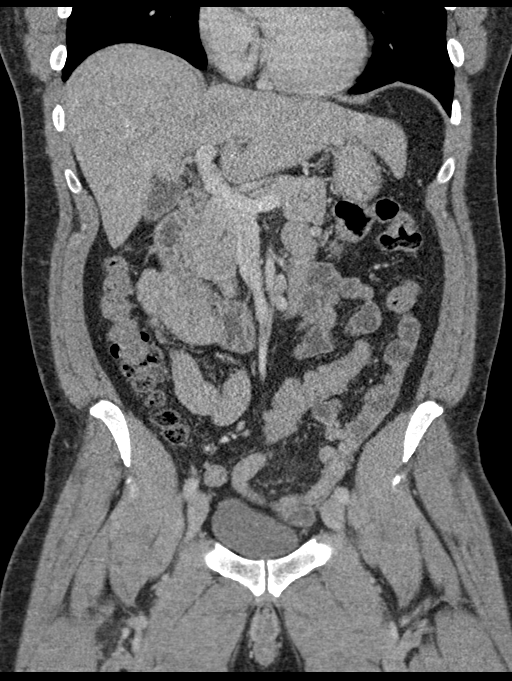
[im 86/155  soft-tissue]
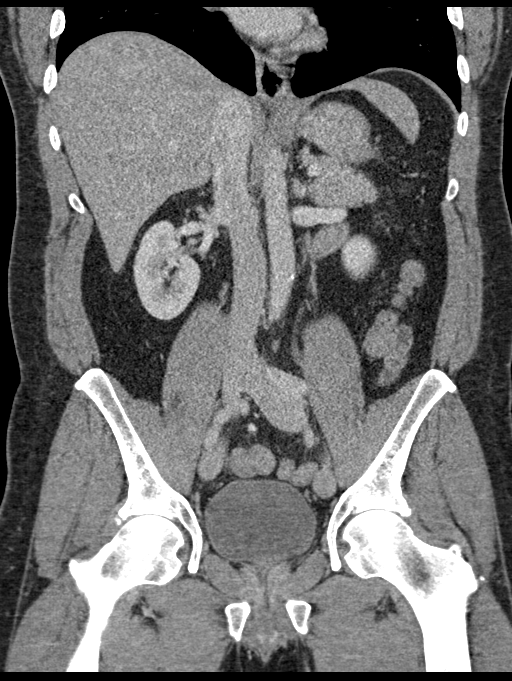

[16 of 46 positions shown; findings below may reference images not displayed]

FINDINGS: Lower chest: Lung bases demonstrate no acute consolidation or
effusion. The heart size is normal.

Hepatobiliary: No focal liver abnormality is seen. No gallstones,
gallbladder wall thickening, or biliary dilatation.

Pancreas: Unremarkable. No pancreatic ductal dilatation or
surrounding inflammatory changes.

Spleen: Normal in size without focal abnormality.

Adrenals/Urinary Tract: Adrenal glands are unremarkable. Kidneys are
normal, without renal calculi, focal lesion, or hydronephrosis.
Bladder is unremarkable.

Stomach/Bowel: Stomach is within normal limits. Appendix appears
normal. No evidence of bowel wall thickening, distention, or
inflammatory changes.

Vascular/Lymphatic: No significant vascular findings are present. No
enlarged abdominal or pelvic lymph nodes.

Reproductive: Prostate is unremarkable.

Other: Negative for free air or free fluid. Small fat containing
right inguinal hernia. Moderate left inguinal hernia containing fat
and small bowel. This is increased compared to 9329 CT. Tiny fluid
in the hernia sac. No soft tissue stranding. No obstruction.

Musculoskeletal: No acute or significant osseous findings.
IMPRESSION: 1. Moderate left inguinal hernia containing mesenteric fat and small
bowel, this is increased in size compared to 9329 CT. Tiny fluid in
the left hernia sac but no inflammatory changes or bowel wall
thickening to suggest incarceration. Negative for upstream
obstruction.

## 2021-06-20 ENCOUNTER — Ambulatory Visit: Payer: Self-pay | Admitting: General Surgery

## 2021-06-20 NOTE — H&P (Signed)
History of Present Illness Axel Filler MD; 06/20/2021 2:38 PM) The patient is a 50 year old male who presents with an inguinal hernia. Patient is a 50 year old male, who comes in secondary to bilateral inguinal hernias, left greater than right. Patient states this is been there for a long period time.  He states that the life-size gotten somewhat larger. Patient underwent previous CT scan which I personally.  His does show a large aside inguinal hernia.,  Bowel contained.  Patient works as a Production designer, theatre/television/film for [employer].     Allergies (Raven Sneed, CMA; 06/20/2021 2:25 PM) No Known Drug Allergies   [03/01/2016]: Allergies Reconciled    Medication History (Raven Sneed, CMA; 06/20/2021 2:25 PM) Medications Reconciled     Review of Systems Axel Filler, MD; 06/20/2021 2:40 PM) All other systems negative  Vitals (Raven Sneed CMA; 06/20/2021 2:25 PM) 06/20/2021 2:24 PM Weight: 260 lb   Temp.: 98.1 F    Pulse: 96 (Regular)    Resp.: 98 (Unlabored)          Physical Exam Axel Filler MD; 06/20/2021 2:38 PM) The physical exam findings are as follows: Note:   Constitutional: No acute distress, conversant, appears stated age  Eyes: Anicteric sclerae, moist conjunctiva, no lid lag  Neck: No thyromegaly, trachea midline, no cervical lymphadenopathy  Lungs: Clear to auscultation biilaterally, normal respiratory effot  Cardiovascular: regular rate & rhythm, no murmurs, no peripheal edema, pedal pulses 2+  GI: Soft, no masses or hepatosplenomegaly, non-tender to palpation  MSK: Normal gait, no clubbing cyanosis, edema  Skin: No rashes, palpation reveals normal skin turgor  Psychiatric: Appropriate judgment and insight, oriented to person, place, and time  Abdomen Inspection Hernias - Inguinal hernia - Bilateral - Incarcerated - Bilateral  (L>R) .    Assessment & Plan Axel Filler MD; 06/20/2021 2:38 PM) BILATERAL INGUINAL HERNIA WITHOUT OBSTRUCTION OR GANGRENE,  RECURRENCE NOT SPECIFIED (K40.20) Impression: Patient is a 50 year old male with bilateral inguinal hernias, left greater than right.  1. The patient will like to proceed to the operating room for robotic bilateral inguinal hernia repair with mesh.  2. I discussed with the patient the signs and symptoms of incarceration and strangulation and the need to proceed to the ER should they occur.  3. I discussed with the patient the risks and benefits of the procedure to include but not limited to: Infection, bleeding, damage to surrounding structures, possible need for further surgery, possible nerve pain, and possible recurrence. The patient was understanding and wishes to proceed.

## 2021-07-12 NOTE — Progress Notes (Addendum)
Surgical Instructions    Your procedure is scheduled on Tuesday,August 2.  Report to [facility] Main Entrance "A" at 11 A.M., then check in with the Admitting office.  Call this number if you have problems the morning of surgery:  [phone]   If you have any questions prior to your surgery date call [phone]: Open Monday-Friday 8am-4pm    Remember:  Do not eat after midnight the night before your surgery  You may drink clear liquids until 10 am the morning of your surgery.   Clear liquids allowed are: Water, Non-Citrus Juices (without pulp), Carbonated Beverages, Clear Tea, Black Coffee Only, and Gatorade  The day of surgery (if you do NOT have diabetes):  Drink ONE (1) Pre-Surgery Clear Ensure by 10am the morning of surgery. Drink in one sitting. Do not sip.  This drink was given to you during your hospital  pre-op appointment visit.  Nothing else to drink after completing the  Pre-Surgery Clear Ensure.         If you have questions, please contact your surgeon's office.      Take these medicines the morning of surgery with A SIP OF WATER :             amLODipine (NORVASC)              Glycerin-Hypromellose-PEG 400 (VISINE DRY EYE) if needed             oxyCODONE-acetaminophen (PERCOCET) if needed   As of today, STOP taking any Aspirin (unless otherwise instructed by your surgeon) Aleve, Naproxen, Ibuprofen, Motrin, Advil, Goody's, BC's, all herbal medications, fish oil, and all vitamins.          Do not wear jewelry or makeup Do not wear lotions, powders, perfumes/colognes, or deodorant. Do not shave 48 hours prior to surgery.  Men may shave face and neck. Do not bring valuables to the hospital. DO Not wear nail polish, gel polish, artificial nails, or any other type of covering on  natural nails including finger and toenails. If patients have artificial nails, gel coating, etc. that need to be removed by a nail salon please have this removed prior to surgery or  surgery may need to be canceled/delayed if the surgeon/ anesthesia feels like the patient is unable to be adequately monitored.             [facility] is not responsible for any belongings or valuables.  Do NOT Smoke (Tobacco/Vaping) or drink Alcohol 24 hours prior to your procedure If you use a CPAP at night, you may bring all equipment for your overnight stay.   Contacts, glasses, dentures or bridgework may not be worn into surgery, please bring cases for these belongings   For patients admitted to the hospital, discharge time will be determined by your treatment team.   Patients discharged the day of surgery will not be allowed to drive home, and someone needs to stay with them for 24 hours.  ONLY 1 SUPPORT PERSON MAY BE PRESENT WHILE YOU ARE IN SURGERY. IF YOU ARE TO BE ADMITTED ONCE YOU ARE IN YOUR ROOM YOU WILL BE ALLOWED TWO (2) VISITORS.  Minor children may have two parents present. Special consideration for safety and communication needs will be reviewed on a case by case basis.  Special instructions:    Oral Hygiene is also important to reduce your risk of infection.  Remember - BRUSH YOUR TEETH THE MORNING OF SURGERY WITH YOUR REGULAR TOOTHPASTE   [place]-  Preparing For Surgery  Before surgery, you can play an important role. Because skin is not sterile, your skin needs to be as free of germs as possible. You can reduce the number of germs on your skin by washing with CHG (chlorahexidine gluconate) Soap before surgery.  CHG is an antiseptic cleaner which kills germs and bonds with the skin to continue killing germs even after washing.     Please do not use if you have an allergy to CHG or antibacterial soaps. If your skin becomes reddened/irritated stop using the CHG.  Do not shave (including legs and underarms) for at least 48 hours prior to first CHG shower. It is OK to shave your face.  Please follow these instructions carefully.     Shower the NIGHT BEFORE SURGERY  and the MORNING OF SURGERY with CHG Soap.   If you chose to wash your hair, wash your hair first as usual with your normal shampoo. After you shampoo, rinse your hair and body thoroughly to remove the shampoo.  Then [employer] and genitals (private parts) with your normal soap and rinse thoroughly to remove soap.  After that Use CHG Soap as you would any other liquid soap. You can apply CHG directly to the skin and wash gently with a scrungie or a clean washcloth.   Apply the CHG Soap to your body ONLY FROM THE NECK DOWN.  Do not use on open wounds or open sores. Avoid contact with your eyes, ears, mouth and genitals (private parts). Wash Face and genitals (private parts)  with your normal soap.   Wash thoroughly, paying special attention to the area where your surgery will be performed.  Thoroughly rinse your body with warm water from the neck down.  DO NOT shower/wash with your normal soap after using and rinsing off the CHG Soap.  Pat yourself dry with a CLEAN TOWEL.  Wear CLEAN PAJAMAS to bed the night before surgery  Place CLEAN SHEETS on your bed the night before your surgery  DO NOT SLEEP WITH PETS.   Day of Surgery:  Take a shower with CHG soap. Wear Clean/Comfortable clothing the morning of surgery Do not apply any deodorants/lotions.   Remember to brush your teeth WITH YOUR REGULAR TOOTHPASTE.   Please read over the following fact sheets that you were given.

## 2021-07-13 ENCOUNTER — Encounter (HOSPITAL_COMMUNITY): Payer: Self-pay

## 2021-07-13 ENCOUNTER — Encounter (HOSPITAL_COMMUNITY)
Admission: RE | Admit: 2021-07-13 | Discharge: 2021-07-13 | Disposition: A | Discharge status: Home / Self Care | Payer: No Typology Code available for payment source | Source: Ambulatory Visit | Attending: General Surgery | Admitting: General Surgery

## 2021-07-13 ENCOUNTER — Other Ambulatory Visit: Payer: Self-pay

## 2021-07-13 DIAGNOSIS — Z01812 Encounter for preprocedural laboratory examination: Secondary | ICD-10-CM | POA: Insufficient documentation

## 2021-07-13 HISTORY — DX: Personal history of urinary calculi: Z87.442

## 2021-07-13 LAB — BASIC METABOLIC PANEL
Anion gap: 6 (ref 5–15)
BUN: 14 mg/dL (ref 6–20)
CO2: 26 mmol/L (ref 22–32)
Calcium: 9 mg/dL (ref 8.9–10.3)
Chloride: 106 mmol/L (ref 98–111)
Creatinine, Ser: 1.48 mg/dL — ABNORMAL HIGH (ref 0.61–1.24)
GFR, Estimated: 58 mL/min — ABNORMAL LOW (ref >60)
Glucose, Bld: 89 mg/dL (ref 70–99)
Potassium: 4.6 mmol/L (ref 3.5–5.1)
Sodium: 138 mmol/L (ref 135–145)

## 2021-07-13 LAB — CBC
HCT: 46.3 % (ref 39.0–52.0)
Hemoglobin: 15.3 g/dL (ref 13.0–17.0)
MCH: 30.1 pg (ref 26.0–34.0)
MCHC: 33 g/dL (ref 30.0–36.0)
MCV: 91 fL (ref 80.0–100.0)
Platelets: 306 10*3/uL (ref 150–400)
RBC: 5.09 MIL/uL (ref 4.22–5.81)
RDW: 14.1 % (ref 11.5–15.5)
WBC: 5.1 10*3/uL (ref 4.0–10.5)
nRBC: 0 % (ref 0.0–0.2)

## 2021-07-13 NOTE — Progress Notes (Signed)
PCP - Leilani Able MD Cardiologist - no  PPM/ICD - denies Device Orders -  Rep Notified -   Chest x-ray -  EKG - 07/13/21 Stress Test -  ECHO -  Cardiac Cath -   Sleep Study - none CPAP -   Fasting Blood Sugar - n/a Checks Blood Sugar _____ times a day  Blood Thinner Instructions:n/a Aspirin Instructions:n/a  ERAS Protcol -clear liquids until 10am PRE-SURGERY Ensure or G2- Ensure  COVID TEST- n/a- ambulatory surgery and pt tested positive 04/26/21.   Anesthesia review: no  Patient denies shortness of breath, fever, cough and chest pain at PAT appointment   All instructions explained to the patient, with a verbal understanding of the material. Patient agrees to go over the instructions while at home for a better understanding. Patient also instructed to self quarantine after being tested for COVID-19. The opportunity to ask questions was provided.

## 2021-07-25 NOTE — Progress Notes (Signed)
07/25/2021 06:31 PM EDT by Sheffield Slider (Self) [phone] ([phone] (Home) [phone] (Home)   Remove   - Left MessageCommunicated - LM with time change to arrive at 0730   07/25/2021 06:32 PM EDT by Ambrose Pancoast (EC) [phone] (Home) [phone] (Home)   Remove   - Missing or Invalid NumberCommunicated - Wrong Number-  removed from chart   07/25/2021 06:37 PM EDT by Ellyn Hack Pressley,Jacqueline (EC) [phone] ([phone] (Home) [phone] (Home)   Remove   - Missing or Invalid NumberCommunicated - Spoke to Mr. Donald Pore, wife of Adela Lank (deceased) is unaware of this patient and does not know of his late wife having a nephew named Veto.

## 2021-07-26 ENCOUNTER — Ambulatory Visit (HOSPITAL_COMMUNITY): Payer: No Typology Code available for payment source

## 2021-07-26 ENCOUNTER — Encounter (HOSPITAL_COMMUNITY)
Admission: RE | Disposition: A | Discharge status: Home / Self Care | Payer: Self-pay | Source: Home / Self Care | Attending: General Surgery

## 2021-07-26 ENCOUNTER — Ambulatory Visit (HOSPITAL_COMMUNITY)
Admission: RE | Admit: 2021-07-26 | Discharge: 2021-07-26 | Disposition: A | Discharge status: Home / Self Care | Payer: No Typology Code available for payment source | Attending: General Surgery | Admitting: General Surgery

## 2021-07-26 ENCOUNTER — Encounter (HOSPITAL_COMMUNITY): Payer: Self-pay | Admitting: General Surgery

## 2021-07-26 DIAGNOSIS — K4020 Bilateral inguinal hernia, without obstruction or gangrene, not specified as recurrent: Secondary | ICD-10-CM | POA: Insufficient documentation

## 2021-07-26 HISTORY — PX: INSERTION OF MESH: SHX5868

## 2021-07-26 HISTORY — PX: XI ROBOTIC ASSISTED INGUINAL HERNIA REPAIR WITH MESH: SHX6706

## 2021-07-26 SURGERY — REPAIR, HERNIA, INGUINAL, ROBOT-ASSISTED, LAPAROSCOPIC, USING MESH
Anesthesia: General | Site: Inguinal | Laterality: Bilateral

## 2021-07-26 MED ORDER — FENTANYL CITRATE (PF) 100 MCG/2ML IJ SOLN
25.0000 ug | INTRAMUSCULAR | Status: DC | PRN
  Administered 2021-07-26 (×2): 50 ug via INTRAVENOUS

## 2021-07-26 MED ORDER — ONDANSETRON HCL 4 MG/2ML IJ SOLN
4.0000 mg | Freq: Four times a day (QID) | INTRAMUSCULAR | Status: DC | PRN
Start: 2021-07-26 — End: 2021-07-26

## 2021-07-26 MED ORDER — SODIUM CHLORIDE 0.9 % IV SOLN
INTRAVENOUS | Status: DC | PRN
  Administered 2021-07-26: 40 mL

## 2021-07-26 MED ORDER — 0.9 % SODIUM CHLORIDE (POUR BTL) OPTIME
TOPICAL | Status: DC | PRN
  Administered 2021-07-26: 1000 mL

## 2021-07-26 MED ORDER — LACTATED RINGERS IV SOLN: INTRAVENOUS | Status: DC

## 2021-07-26 MED ORDER — LIDOCAINE 2% (20 MG/ML) 5 ML SYRINGE
INTRAMUSCULAR | Status: AC
  Filled 2021-07-26: qty 5

## 2021-07-26 MED ORDER — BUPIVACAINE HCL 0.25 % IJ SOLN
INTRAMUSCULAR | Status: DC | PRN
  Administered 2021-07-26: 7 mL

## 2021-07-26 MED ORDER — MIDAZOLAM HCL 2 MG/2ML IJ SOLN
INTRAMUSCULAR | Status: AC
  Filled 2021-07-26: qty 2

## 2021-07-26 MED ORDER — STERILE WATER FOR IRRIGATION IR SOLN
Status: DC | PRN
  Administered 2021-07-26: 1000 mL

## 2021-07-26 MED ORDER — ONDANSETRON HCL 4 MG/2ML IJ SOLN
INTRAMUSCULAR | Status: AC
  Filled 2021-07-26: qty 2

## 2021-07-26 MED ORDER — CEFAZOLIN SODIUM-DEXTROSE 2-4 GM/100ML-% IV SOLN
2.0000 g | INTRAVENOUS | Status: AC
  Administered 2021-07-26: 2 g via INTRAVENOUS
  Filled 2021-07-26: qty 100

## 2021-07-26 MED ORDER — LIDOCAINE 2% (20 MG/ML) 5 ML SYRINGE
INTRAMUSCULAR | Status: DC | PRN
  Administered 2021-07-26: 60 mg via INTRAVENOUS

## 2021-07-26 MED ORDER — ORAL CARE MOUTH RINSE: 15.0000 mL | Freq: Once | OROMUCOSAL | Status: AC

## 2021-07-26 MED ORDER — MIDAZOLAM HCL 2 MG/2ML IJ SOLN
INTRAMUSCULAR | Status: DC | PRN
  Administered 2021-07-26: 2 mg via INTRAVENOUS

## 2021-07-26 MED ORDER — ESMOLOL HCL 100 MG/10ML IV SOLN
INTRAVENOUS | Status: DC | PRN
  Administered 2021-07-26: 40 mg via INTRAVENOUS

## 2021-07-26 MED ORDER — ROCURONIUM BROMIDE 10 MG/ML (PF) SYRINGE
PREFILLED_SYRINGE | INTRAVENOUS | Status: AC
  Filled 2021-07-26: qty 10

## 2021-07-26 MED ORDER — FENTANYL CITRATE (PF) 100 MCG/2ML IJ SOLN
INTRAMUSCULAR | Status: AC
  Filled 2021-07-26: qty 2

## 2021-07-26 MED ORDER — OXYCODONE HCL 5 MG PO TABS: 5.0000 mg | ORAL_TABLET | Freq: Once | ORAL | Status: DC | PRN

## 2021-07-26 MED ORDER — DEXAMETHASONE SODIUM PHOSPHATE 10 MG/ML IJ SOLN
INTRAMUSCULAR | Status: AC
  Filled 2021-07-26: qty 1

## 2021-07-26 MED ORDER — FENTANYL CITRATE (PF) 250 MCG/5ML IJ SOLN
INTRAMUSCULAR | Status: DC | PRN
  Administered 2021-07-26: 100 ug via INTRAVENOUS
  Administered 2021-07-26 (×2): 50 ug via INTRAVENOUS
  Administered 2021-07-26: 150 ug via INTRAVENOUS
  Administered 2021-07-26 (×2): 50 ug via INTRAVENOUS

## 2021-07-26 MED ORDER — FENTANYL CITRATE (PF) 250 MCG/5ML IJ SOLN
INTRAMUSCULAR | Status: AC
  Filled 2021-07-26: qty 5

## 2021-07-26 MED ORDER — CHLORHEXIDINE GLUCONATE CLOTH 2 % EX PADS: 6.0000 | MEDICATED_PAD | Freq: Once | CUTANEOUS | Status: DC

## 2021-07-26 MED ORDER — CHLORHEXIDINE GLUCONATE CLOTH 2 % EX PADS
6.0000 | MEDICATED_PAD | Freq: Once | CUTANEOUS | Status: DC
Start: 2021-07-26 — End: 2021-07-26

## 2021-07-26 MED ORDER — OXYCODONE HCL 5 MG/5ML PO SOLN: 5.0000 mg | Freq: Once | ORAL | Status: DC | PRN

## 2021-07-26 MED ORDER — SUGAMMADEX SODIUM 200 MG/2ML IV SOLN
INTRAVENOUS | Status: DC | PRN
  Administered 2021-07-26: 200 mg via INTRAVENOUS

## 2021-07-26 MED ORDER — TRAMADOL HCL 50 MG PO TABS: 50.0000 mg | ORAL_TABLET | Freq: Four times a day (QID) | ORAL | 0 refills | Status: AC | PRN

## 2021-07-26 MED ORDER — DEXMEDETOMIDINE (PRECEDEX) IN NS 20 MCG/5ML (4 MCG/ML) IV SYRINGE
PREFILLED_SYRINGE | INTRAVENOUS | Status: DC | PRN
  Administered 2021-07-26: 8 ug via INTRAVENOUS
  Administered 2021-07-26: 4 ug via INTRAVENOUS
  Administered 2021-07-26: 8 ug via INTRAVENOUS
  Administered 2021-07-26: 4 ug via INTRAVENOUS
  Administered 2021-07-26 (×2): 8 ug via INTRAVENOUS

## 2021-07-26 MED ORDER — PROPOFOL 10 MG/ML IV BOLUS
INTRAVENOUS | Status: AC
  Filled 2021-07-26: qty 20

## 2021-07-26 MED ORDER — ENSURE PRE-SURGERY PO LIQD: 296.0000 mL | Freq: Once | ORAL | Status: DC

## 2021-07-26 MED ORDER — PHENYLEPHRINE HCL (PRESSORS) 10 MG/ML IV SOLN
INTRAVENOUS | Status: AC
  Filled 2021-07-26: qty 1

## 2021-07-26 MED ORDER — ACETAMINOPHEN 500 MG PO TABS
1000.0000 mg | ORAL_TABLET | ORAL | Status: AC
  Administered 2021-07-26: 1000 mg via ORAL
  Filled 2021-07-26: qty 2

## 2021-07-26 MED ORDER — CHLORHEXIDINE GLUCONATE 0.12 % MT SOLN
15.0000 mL | Freq: Once | OROMUCOSAL | Status: AC
  Administered 2021-07-26: 15 mL via OROMUCOSAL
  Filled 2021-07-26: qty 15

## 2021-07-26 MED ORDER — BUPIVACAINE HCL (PF) 0.25 % IJ SOLN
INTRAMUSCULAR | Status: AC
  Filled 2021-07-26: qty 30

## 2021-07-26 MED ORDER — DEXAMETHASONE SODIUM PHOSPHATE 10 MG/ML IJ SOLN
INTRAMUSCULAR | Status: DC | PRN
  Administered 2021-07-26: 5 mg via INTRAVENOUS

## 2021-07-26 MED ORDER — PROPOFOL 10 MG/ML IV BOLUS
INTRAVENOUS | Status: DC | PRN
  Administered 2021-07-26: 200 mg via INTRAVENOUS
  Administered 2021-07-26: 40 mg via INTRAVENOUS
  Administered 2021-07-26: 60 mg via INTRAVENOUS

## 2021-07-26 MED ORDER — ROCURONIUM BROMIDE 10 MG/ML (PF) SYRINGE
PREFILLED_SYRINGE | INTRAVENOUS | Status: DC | PRN
  Administered 2021-07-26: 60 mg via INTRAVENOUS
  Administered 2021-07-26: 30 mg via INTRAVENOUS

## 2021-07-26 MED ORDER — ONDANSETRON HCL 4 MG/2ML IJ SOLN
INTRAMUSCULAR | Status: DC | PRN
  Administered 2021-07-26: 4 mg via INTRAVENOUS

## 2021-07-26 MED ORDER — BUPIVACAINE LIPOSOME 1.3 % IJ SUSP
INTRAMUSCULAR | Status: AC
  Filled 2021-07-26: qty 20

## 2021-07-26 SURGICAL SUPPLY — 46 items
CHLORAPREP W/TINT 26 (MISCELLANEOUS) ×2 IMPLANT
CLIP LIGATING HEMO O LOK GREEN (MISCELLANEOUS) ×2 IMPLANT
CLIP TI WIDE RED SMALL 6 (CLIP) ×2 IMPLANT
COVER MAYO STAND STRL (DRAPES) ×2 IMPLANT
COVER SURGICAL LIGHT HANDLE (MISCELLANEOUS) ×2 IMPLANT
COVER TIP SHEARS 8 DVNC (MISCELLANEOUS) ×1 IMPLANT
COVER TIP SHEARS 8MM DA VINCI (MISCELLANEOUS) ×2
DEFOGGER SCOPE WARMER CLEARIFY (MISCELLANEOUS) ×2 IMPLANT
DERMABOND ADVANCED (GAUZE/BANDAGES/DRESSINGS) ×1
DERMABOND ADVANCED .7 DNX12 (GAUZE/BANDAGES/DRESSINGS) ×1 IMPLANT
DEVICE TROCAR PUNCTURE CLOSURE (ENDOMECHANICALS) ×2 IMPLANT
DRAPE ARM DVNC X/XI (DISPOSABLE) ×4 IMPLANT
DRAPE COLUMN DVNC XI (DISPOSABLE) ×1 IMPLANT
DRAPE CV SPLIT W-CLR ANES SCRN (DRAPES) ×2 IMPLANT
DRAPE DA VINCI XI ARM (DISPOSABLE) ×8
DRAPE DA VINCI XI COLUMN (DISPOSABLE) ×2
DRAPE ORTHO SPLIT 77X108 STRL (DRAPES) ×2
DRAPE SURG ORHT 6 SPLT 77X108 (DRAPES) ×1 IMPLANT
ELECT REM PT RETURN 9FT ADLT (ELECTROSURGICAL) ×2
ELECTRODE REM PT RTRN 9FT ADLT (ELECTROSURGICAL) ×1 IMPLANT
GLOVE SURG ENC MOIS LTX SZ7.5 (GLOVE) ×4 IMPLANT
GOWN STRL REUS W/ TWL LRG LVL3 (GOWN DISPOSABLE) ×2 IMPLANT
GOWN STRL REUS W/ TWL XL LVL3 (GOWN DISPOSABLE) ×2 IMPLANT
GOWN STRL REUS W/TWL 2XL LVL3 (GOWN DISPOSABLE) ×2 IMPLANT
GOWN STRL REUS W/TWL LRG LVL3 (GOWN DISPOSABLE) ×4
GOWN STRL REUS W/TWL XL LVL3 (GOWN DISPOSABLE) ×4
KIT BASIN OR (CUSTOM PROCEDURE TRAY) ×2 IMPLANT
KIT TURNOVER KIT B (KITS) ×2 IMPLANT
MARKER SKIN DUAL TIP RULER LAB (MISCELLANEOUS) ×2 IMPLANT
MESH PROGRIP LAP SELF FIXATING (Mesh General) ×4 IMPLANT
MESH PROGRIP LAP SLF FIX 16X12 (Mesh General) ×2 IMPLANT
NEEDLE HYPO 22GX1.5 SAFETY (NEEDLE) ×2 IMPLANT
NEEDLE INSUFFLATION 14GA 120MM (NEEDLE) ×2 IMPLANT
PAD ARMBOARD 7.5X6 YLW CONV (MISCELLANEOUS) ×4 IMPLANT
PENCIL BUTTON HOLSTER BLD 10FT (ELECTRODE) ×2 IMPLANT
SEAL CANN UNIV 5-8 DVNC XI (MISCELLANEOUS) ×2 IMPLANT
SEAL XI 5MM-8MM UNIVERSAL (MISCELLANEOUS) ×4
SET TUBE SMOKE EVAC HIGH FLOW (TUBING) ×2 IMPLANT
STOPCOCK 4 WAY LG BORE MALE ST (IV SETS) ×2 IMPLANT
SUT MNCRL AB 4-0 PS2 18 (SUTURE) ×2 IMPLANT
SUT VIC AB 2-0 SH 27 (SUTURE) ×2
SUT VIC AB 2-0 SH 27X BRD (SUTURE) ×1 IMPLANT
SUT VLOC 180 2-0 9IN GS21 (SUTURE) ×4 IMPLANT
SYR TOOMEY 50ML (SYRINGE) ×2 IMPLANT
TOWEL GREEN STERILE FF (TOWEL DISPOSABLE) ×2 IMPLANT
TRAY LAPAROSCOPIC MC (CUSTOM PROCEDURE TRAY) ×2 IMPLANT

## 2021-07-26 NOTE — Discharge Instructions (Signed)
[facility] _______[facility] ? ?INGUINAL HERNIA REPAIR: POST OP INSTRUCTIONS ? ?Always review your discharge instruction sheet given to you by the facility where your surgery was performed. ?IF YOU HAVE DISABILITY OR FAMILY LEAVE FORMS, YOU MUST BRING THEM TO THE OFFICE FOR PROCESSING.   ?DO NOT GIVE THEM TO YOUR DOCTOR. ? ?1. A  prescription for pain medication may be given to you upon discharge.  Take your pain medication as prescribed, if needed.  If narcotic pain medicine is not needed, then you may take acetaminophen (Tylenol) or ibuprofen (Advil) as needed. ?2. Take your usually prescribed medications unless otherwise directed. ?If you need a refill on your pain medication, please contact your pharmacy.  They will contact our office to request authorization. Prescriptions will not be filled after 5 pm or on week-ends. ?3. You should follow a light diet the first 24 hours after arrival home, such as soup and crackers, etc.  Be sure to include lots of fluids daily.  Resume your normal diet the day after surgery. ?4.Most patients will experience some swelling and bruising around the umbilicus or in the groin and scrotum.  Ice packs and reclining will help.  Swelling and bruising can take several days to resolve.  ?6. It is common to experience some constipation if taking pain medication after surgery.  Increasing fluid intake and taking a stool softener (such as Colace) will usually help or prevent this problem from occurring.  A mild laxative (Milk of Magnesia or Miralax) should be taken according to package directions if there are no bowel movements after 48 hours. ?7. Unless discharge instructions indicate otherwise, you may remove your bandages 24-48 hours after surgery, and you may shower at that time.  You may have steri-strips (small skin tapes) in place directly over the incision.  These strips should be left on the skin for 7-10 days.  If your surgeon used skin glue on the incision, you may  shower in 24 hours.  The glue will flake off over the next 2-3 weeks.  Any sutures or staples will be removed at the office during your follow-up visit. ?8. ACTIVITIES:  You may resume regular (light) daily activities beginning the next day--such as daily self-care, walking, climbing stairs--gradually increasing activities as tolerated.  You may have sexual intercourse when it is comfortable.  Refrain from any heavy lifting or straining until approved by your doctor. ? ?a.You may drive when you are no longer taking prescription pain medication, you can comfortably wear a seatbelt, and you can safely maneuver your car and apply brakes. ?b.RETURN TO WORK:   ?_____________________________________________ ? ?9.You should see your doctor in the office for a follow-up appointment approximately 2-3 weeks after your surgery.  Make sure that you call for this appointment within a day or two after you arrive home to insure a convenient appointment time. ?10.OTHER INSTRUCTIONS: _________________________ ?   _____________________________________ ? ?WHEN TO CALL YOUR DOCTOR: ?Fever over 101.0 ?Inability to urinate ?Nausea and/or vomiting ?Extreme swelling or bruising ?Continued bleeding from incision. ?Increased pain, redness, or drainage from the incision ? ?The clinic staff is available to answer your questions during regular business hours.  Please don?t hesitate to call and ask to speak to one of the nurses for clinical concerns.  If you have a medical emergency, go to the nearest emergency room or call 911.  A surgeon from [facility] is always on call at the hospital ? ? ?[address], [address], [place], [place]    [address] ? ? [address] [address], [place], [place]   [address] ?[phone] ? [phone] ? FAX [phone] ?Web site: [email or web address] ? ?

## 2021-07-26 NOTE — Anesthesia Preprocedure Evaluation (Signed)
Anesthesia Evaluation  Patient identified by MRN, date of birth, ID band Patient awake    Reviewed: Allergy & Precautions, H&P , NPO status , Patient's Chart, lab work & pertinent test results  Airway Mallampati: II   Neck ROM: full    Dental   Pulmonary Current Smoker,    breath sounds clear to auscultation       Cardiovascular hypertension,  Rhythm:regular Rate:Normal     Neuro/Psych    GI/Hepatic   Endo/Other    Renal/GU stones     Musculoskeletal   Abdominal   Peds  Hematology   Anesthesia Other Findings   Reproductive/Obstetrics                             Anesthesia Physical Anesthesia Plan  ASA: 2  Anesthesia Plan: General   Post-op Pain Management:    Induction: Intravenous  PONV Risk Score and Plan: 1 and Ondansetron, Dexamethasone, Midazolam and Treatment may vary due to age or medical condition  Airway Management Planned: Oral ETT  Additional Equipment:   Intra-op Plan:   Post-operative Plan: Extubation in OR  Informed Consent: I have reviewed the patients History and Physical, chart, labs and discussed the procedure including the risks, benefits and alternatives for the proposed anesthesia with the patient or authorized representative who has indicated his/her understanding and acceptance.     Dental advisory given  Plan Discussed with: CRNA, Anesthesiologist and Surgeon  Anesthesia Plan Comments:         Anesthesia Quick Evaluation

## 2021-07-26 NOTE — H&P (Signed)
  History of Present IllnessThe patient is a 50 year old male who presents with an inguinal hernia. Patient is a 50 year old male, who comes in secondary to bilateral inguinal hernias, left greater than right. Patient states this is been there for a long period time.  He states that the life-size gotten somewhat larger. Patient underwent previous CT scan which I personally.  His does show a large aside inguinal hernia.,  Bowel contained.   Patient works as a Production designer, theatre/television/film for [employer].         Allergies No Known Drug Allergies   [03/01/2016]: Allergies Reconciled     Medication History  Medications Reconciled        Review of Systems  All other systems negative   BP (!) 163/106   Temp 98.7 F (37.1 C) (Oral)   Resp 18   SpO2 95%       Physical Exam The physical exam findings are as follows: Note:   Constitutional: No acute distress, conversant, appears stated age   Eyes: Anicteric sclerae, moist conjunctiva, no lid lag   Neck: No thyromegaly, trachea midline, no cervical lymphadenopathy   Lungs: Clear to auscultation biilaterally, normal respiratory effot   Cardiovascular: regular rate & rhythm, no murmurs, no peripheal edema, pedal pulses 2+   GI: Soft, no masses or hepatosplenomegaly, non-tender to palpation   MSK: Normal gait, no clubbing cyanosis, edema   Skin: No rashes, palpation reveals normal skin turgor   Psychiatric: Appropriate judgment and insight, oriented to person, place, and time   Abdomen Inspection Hernias - Inguinal hernia - Bilateral - Incarcerated - Bilateral  (L>R) .       Assessment & Plan  BILATERAL INGUINAL HERNIA WITHOUT OBSTRUCTION OR GANGRENE, RECURRENCE NOT SPECIFIED (K40.20) Impression: Patient is a 50 year old male with bilateral inguinal hernias, left greater than right.   1. The patient will like to proceed to the operating room for robotic bilateral inguinal hernia repair with mesh.   2. I discussed with the patient the signs  and symptoms of incarceration and strangulation and the need to proceed to the ER should they occur.   3. I discussed with the patient the risks and benefits of the procedure to include but not limited to: Infection, bleeding, damage to surrounding structures, possible need for further surgery, possible nerve pain, and possible recurrence. The patient was understanding and wishes to proceed.

## 2021-07-26 NOTE — Anesthesia Procedure Notes (Signed)
Procedure Name: Intubation Date/Time: 07/26/2021 9:43 AM Performed by: Leonor Liv, CRNA Pre-anesthesia Checklist: Patient identified, Emergency Drugs available, Suction available and Patient being monitored Patient Re-evaluated:Patient Re-evaluated prior to induction Oxygen Delivery Method: Circle System Utilized Preoxygenation: Pre-oxygenation with 100% oxygen Induction Type: IV induction Ventilation: Mask ventilation without difficulty Laryngoscope Size: Mac and 4 Grade View: Grade II Tube type: Oral Tube size: 7.5 mm Number of attempts: 1 Airway Equipment and Method: Stylet and Oral airway Placement Confirmation: ETT inserted through vocal cords under direct vision, positive ETCO2 and breath sounds checked- equal and bilateral Secured at: 23 cm Tube secured with: Tape Dental Injury: Teeth and Oropharynx as per pre-operative assessment  Comments: Inserted by Paulina Fusi, SRNA

## 2021-07-26 NOTE — Op Note (Signed)
07/26/2021  11:18 AM  PATIENT:  Chad Saunders  50 y.o. male  PRE-OPERATIVE DIAGNOSIS:  BILATERAL INGUINAL HERNIA  POST-OPERATIVE DIAGNOSIS:  BILATERAL INGUINAL HERNIA  PROCEDURE:  Procedure(s): XI ROBOTIC ASSISTED BILATERAL INGUINAL HERNIA REPAIR WITH MESH (Bilateral)INSERTION OF MESH (Bilateral) Right Direct Left indirect + cord lipoma  SURGEON:  Surgeon(s) and Role:    Axel Filler, MD - Primary ASSISTANTS: Patrici Ranks, RNFA   ANESTHESIA:   regional and general  EBL:  15cc   BLOOD ADMINISTERED:none  DRAINS: none   LOCAL MEDICATIONS USED:  BUPIVICAINE  and OTHER exparel  SPECIMEN:  No Specimen  DISPOSITION OF SPECIMEN:  N/A  COUNTS:  YES  TOURNIQUET:  * No tourniquets in log *  DICTATION: .Dragon Dictation  Findings: Large Right Direct Left indirect + cord lipoma  The patient was taken back to the operating room. The patient was placed in supine position with bilateral SCDs in place. The patient was prepped and draped in the usual sterile fashion.  After appropriate anitbiotics were confirmed, a time-out was confirmed and all facts were verified.  At this time a Veress needle technique was used to inspect the abdomen approximately 10 cm from the umbilicus in the  Midclavicular line. This time a 12 mm robotic trocar was placed into the abdomen. The camera was placed there was no injury to any intra-abdominal organs. A 40mm umbilical port was placed just superior to the umbilicus. An 8 mm port was placed approximately 10 cm lateral to the umbilicus on the left paramedian side.   Robot was positioned over the patient and the ports were docked in the usual fashion.  At this time the right-sided peritoneum was taken down from the medial umbilical ligament laterally. The pre-peritoneal space was entered. Dissection was taken down to Cooper's ligament. There was some bleeding from the epigastrics on the right.  This was cauterized and clipped. At this time it was  apparent there was a small indirect hernia.  At this time proceeded clean out the rest Cooper's ligament and the medial to lateral direction. A proceeded laterally to dissect the spermatic cord. The spermatic cord was circumferentially dissected away from the surrounding musculature and tissue. The vas deferens was identified and protected all portions of the case. There was a small indirect hernia at the base of the spermatic cord. This was dissected back.  A small cord lipoma was also dissected out of the internal ring.  At this time I proceeded to create a pocket laterally for the mesh. Once the pocket was created the peritoneum was stripped back to approximately the base of the cord.   At this time I turned my attention to the Left side.  The peritoneum was taken down from the medial umbilical ligament laterally. The pre-peritoneal space was entered. Dissection was taken down to Cooper's ligament. At this time it was apparent there was a large direct hernia.  At this time proceeded clean out the rest Cooper's ligament and the medial to lateral direction.  The hernia was reduced and the tranversalis fascia retracted spontaneously.  I proceeded laterally to dissect the spermatic cord. The spermatic cord was circumferentially dissected away from the surrounding musculature and tissue. The vas deferens was identified and protected all portions of the case.  At this time I proceeded to create a pocket laterally for the mesh. Once the pocket was created the peritoneum was stripped back to approximately the base of the cord.  At this 2 pieces of 15x12cm PRogrip mesh  was placed into the abdomen as were the v-lock sutures.  The  meshes were placed bilaterally to cover the indirect, direct, and femoral spaces.  At this time a 2-0 V- lock stitch was used to close the peritoneum in a [place] running fashion.  At this time the robot was undocked. The umbilical port site was reapproximated using a 0 Vicryl via an Endo  Close device 1. All ports were removed. The skin was reapproximated and all port sites using 4-0 Monocryl subcuticular fashion.  The patient the procedure well was taken to the recovery    PLAN OF CARE: Discharge to home after PACU  PATIENT DISPOSITION:  PACU - hemodynamically stable.   Delay start of Pharmacological VTE agent (>24hrs) due to surgical blood loss or risk of bleeding: not applicable

## 2021-07-26 NOTE — Transfer of Care (Signed)
Immediate Anesthesia Transfer of Care Note  Patient: Chad Saunders  Procedure(s) Performed: XI ROBOTIC ASSISTED BILATERAL INGUINAL HERNIA REPAIR WITH MESH (Bilateral: Inguinal) INSERTION OF MESH (Bilateral: Inguinal)  Patient Location: PACU  Anesthesia Type:General  Level of Consciousness: awake, alert  and patient cooperative  Airway & Oxygen Therapy: Patient Spontanous Breathing and Patient connected to face mask oxygen  Post-op Assessment: Report given to RN and Post -op Vital signs reviewed and stable  Post vital signs: Reviewed and stable  Last Vitals:  Vitals Value Taken Time  BP 156/118 07/26/21 1147  Temp    Pulse 81 07/26/21 1149  Resp 16 07/26/21 1149  SpO2 96 % 07/26/21 1149  Vitals shown include unvalidated device data.  Last Pain:  Vitals:   07/26/21 0723  TempSrc:   PainSc: 5       Patients Stated Pain Goal: 2 (07/26/21 0723)  Complications: No notable events documented.

## 2021-07-27 ENCOUNTER — Encounter (HOSPITAL_COMMUNITY): Payer: Self-pay | Admitting: General Surgery

## 2021-07-27 NOTE — Anesthesia Postprocedure Evaluation (Signed)
Anesthesia Post Note  Patient: Chad Saunders  Procedure(s) Performed: XI ROBOTIC ASSISTED BILATERAL INGUINAL HERNIA REPAIR WITH MESH (Bilateral: Inguinal) INSERTION OF MESH (Bilateral: Inguinal)     Patient location during evaluation: PACU Anesthesia Type: General Level of consciousness: awake and alert Pain management: pain level controlled Vital Signs Assessment: post-procedure vital signs reviewed and stable Respiratory status: spontaneous breathing, nonlabored ventilation, respiratory function stable and patient connected to nasal cannula oxygen Cardiovascular status: blood pressure returned to baseline and stable Postop Assessment: no apparent nausea or vomiting Anesthetic complications: no   No notable events documented.  Last Vitals:  Vitals:   07/26/21 1245 07/26/21 1251  BP:  (!) 136/95  Pulse: 70 65  Resp: 15 14  Temp:  36.6 C  SpO2: 94% 95%    Last Pain:  Vitals:   07/26/21 1230  TempSrc:   PainSc: 8                  Thijs Brunton S
